# Patient Record
Sex: Male | Born: 1960 | Race: Black or African American | Hispanic: No | Marital: Single | Smoking: Never smoker
Health system: Southern US, Community
[De-identification: ages and names within clinical notes are randomized; demographics above are authoritative.]

## PROBLEM LIST (undated history)

## (undated) DIAGNOSIS — N289 Disorder of kidney and ureter, unspecified: Secondary | ICD-10-CM

## (undated) DIAGNOSIS — N200 Calculus of kidney: Secondary | ICD-10-CM

## (undated) HISTORY — PX: OTHER SURGICAL HISTORY: SHX169

## (undated) HISTORY — PX: SPLENECTOMY: SUR1306

## (undated) HISTORY — PX: BACK SURGERY: SHX140

---

## 2004-03-19 ENCOUNTER — Encounter: Admission: RE | Admit: 2004-03-19 | Discharge: 2004-03-19 | Payer: Self-pay | Admitting: Family Medicine

## 2004-03-21 ENCOUNTER — Encounter: Admission: RE | Admit: 2004-03-21 | Discharge: 2004-03-21 | Payer: Self-pay | Admitting: Family Medicine

## 2004-04-07 ENCOUNTER — Inpatient Hospital Stay (HOSPITAL_COMMUNITY): Admission: RE | Admit: 2004-04-07 | Discharge: 2004-04-09 | Payer: Self-pay | Admitting: Neurosurgery

## 2004-05-05 ENCOUNTER — Ambulatory Visit (HOSPITAL_COMMUNITY): Admission: RE | Admit: 2004-05-05 | Discharge: 2004-05-06 | Payer: Self-pay | Admitting: Neurosurgery

## 2004-06-03 ENCOUNTER — Encounter: Admission: RE | Admit: 2004-06-03 | Discharge: 2004-06-03 | Payer: Self-pay | Admitting: Neurosurgery

## 2004-08-03 ENCOUNTER — Encounter: Admission: RE | Admit: 2004-08-03 | Discharge: 2004-08-03 | Payer: Self-pay | Admitting: Neurosurgery

## 2004-09-30 ENCOUNTER — Encounter: Admission: RE | Admit: 2004-09-30 | Discharge: 2004-09-30 | Payer: Self-pay | Admitting: Neurosurgery

## 2005-04-06 ENCOUNTER — Encounter: Admission: RE | Admit: 2005-04-06 | Discharge: 2005-04-06 | Payer: Self-pay | Admitting: Neurosurgery

## 2007-09-30 ENCOUNTER — Emergency Department (HOSPITAL_COMMUNITY): Admission: EM | Admit: 2007-09-30 | Discharge: 2007-10-01 | Payer: Self-pay | Admitting: Emergency Medicine

## 2010-09-24 NOTE — Op Note (Signed)
NAME:  Perry Hart, Perry Hart NO.:  [id number]   MEDICAL RECORD NO.:  [id number]          PATIENT TYPE:  INP   LOCATION:  2899                         FACILITY:  [facility]   PHYSICIAN:  Donalee Citrin, M.D.        DATE OF BIRTH:  January 31, 1961   DATE OF PROCEDURE:  04/07/2004  DATE OF DISCHARGE:                                 OPERATIVE REPORT   PREOPERATIVE DIAGNOSIS:  Chiari 1 malformation with syringomyelia.   POSTOPERATIVE DIAGNOSIS:  Chiari 1 malformation with syringomyelia.   OPERATION PERFORMED:  Suboccipital craniectomy for decompression of Chiari 1  malformation and C1 laminectomy.   SURGEON:  Donalee Citrin, M.D.   ASSISTANT:  Tia Alert, MD   ANESTHESIA:  General endotracheal.   INDICATIONS FOR PROCEDURE:  The patient is a very pleasant 50 year old  gentleman who over the last several months has noticed progressive numbness  and weakness of his right arm and right leg and occasional numbness on the  right side of the face, no left-sided symptoms.  Physical exam revealed a  myelopathy and preoperative x-ray showed cervical spinal cord syrinx  extending from approximately C4-5 down to about T2.  This also showed a  Chiari 1 malformation with tonsil descension down to the superior aspect of  the C1 lamina as well as a C5-6 disk herniation.  It was felt that the  syringomyelia was predominantly due to Chiari malformation and the patient  was first recommended suboccipital craniectomy.  I extensively went over the  risks and benefits of surgery with her.  He understands and agrees to  proceed forward.   DESCRIPTION OF PROCEDURE:  The patient was brought to the operating room  where he was induced under general anesthesia.  Placed prone in pins, the  neck in slight flexion.  The back side of his head was shaved, prepped and  draped in the usual sterile fashion after infiltration of 10 mL of lidocaine  with epinephrine.  A midline incision was made just inferior to the  inion  down just inferior to the C2 spinous process. Bovie electrocautery was used  to take down subcutaneous tissue, taking care to stay in the midline.  Subperiosteal dissection was carried out around the lamina of C2, C1 and the  supocciput.  At the sub occiput has been exposed, a high speed drill was  used to drill craniectomies along each cerebellar hemisphere.  Then using a  3 mm Kerrison punch, the craniectomy was extended across the midline and  down, opening up the foramen magnum.  Then the C1 lamina was also removed  with a 3 mm Kerrison punch.  So when both cerebellar hemispheres, cervical  medullary junction and the dura underneath the C1 lamina was exposed, a  Nurolon was placed in the dura overlying the right cerebellar hemisphere and  this was used to tent the dura and incised with an 11 blade scalpel.  Then  using a hockey stick and an 11 blade scalpel, the dura was opened in a Y-  shaped fashion down along the cerebellar hemispheres meeting in the midline  extending down  above the cervical medullary junction and cervical spinal  cord behind the C1 lamina.  The tonsils were immediately visualized  extending down to the C1 lamina.  In addition there was noted to be dense  adhesions tethering the tonsils to the dorsal dura as well as to the spinal  cord.  In addition there was also dense adhesions tethering these tonsils to  each other.  Half of the dura had been tented back with Nurolon using  careful microdissection techniques with Roten dissectors.  The tonsils were  dissected off of the dorsal dura as well as the adhesions along the C1 and  distal C1 lamina were freed up intradurally.  The tonsils were freed up from  the cervical medullary junction and cervical spinal cord and the space  between the tonsils was opened up and the adhesions were lysed exposing the  obex of the fourth ventricle.  This was noted to be completely obstructing  spinal fluid flow until the obex  was opened up.  Cord was immediately  visualized and spinal fluid egress was achieved.  At the end of the lysis of  adhesions between the tonsils, the dorsal dura and the spinal cord, there  was noted to be free spinal fluid flow from the fourth ventricle between the  tonsils.  After this was achieved, a 6 x 8 mm Duragard patch was cut,  fashioned and sewn along the dural leaflets with a combination of running 4-  0 Nurolon and some interrupteds.  After the patch had been sewn together,  Valsalva was obtained.  There was no spinal fluid leak appreciated along the  suture line.  Tisseel was then applied along the suture line as well as  Duragen plus.  Then the muscle and fascia were reapproximated with 0  interrupted Vicryl and subcutaneous tissue was closed with 2-0 interrupted  Vicryl and the skin was closed with running locking nylon.  At the end of  the case all sponge, needle and instrument counts were correct. The patient  was taken out of pins, extubated and sent to recovery room in stable  condition.       GC/MEDQ  D:  04/07/2004  T:  04/07/2004  Job:  [id number]

## 2010-09-24 NOTE — Op Note (Signed)
NAME:  Perry Hart, Perry Hart NO.:  [id number]   MEDICAL RECORD NO.:  [id number]          PATIENT TYPE:  OIB   LOCATION:  2899                         FACILITY:  [facility]   PHYSICIAN:  Donalee Citrin, M.D.        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   PROCEDURE:  Anterior cervical discectomy and fusion at C5-C6 using a 7 mm  Lifenet wedge and 25 mm Atlantis plate with four 13 mm variable angle  screws.   SURGEON:  Donalee Citrin, M.D.   ASSISTANT:  Tia Alert, MD   ANESTHESIA:  General endotracheal anesthesia.   HISTORY OF PRESENT ILLNESS:  The patient is a very pleasant 50 year old  gentleman who had progressive worsening right hemiparesis with preop imaging  showing a very large cervical spinal cord syrinx, dissection of Chiari  malformation, as well as a very large central C5-C6 ruptured disc causing  spinal cord compression.  The patient, at that time, due to his spinal cord  compression both from the tonsillar compression of the cervical medullary  junction as well as the disc rupture, was recommended Chiari I decompression  with suboccipital decompression followed by an anterior cervical discectomy  and fusion.  The patient had undergone the Chiari decompression three weeks  prior and now presents for the anterior cervical.  The risks and benefits  were explained to the patient, he understood and agreed to proceed forth.   DESCRIPTION OF PROCEDURE:  The patient was brought to  the operating room,  given general anesthesia, positioned supine with the neck in slight  extension and 5 pounds of halter traction.  The right side of the neck was  prepped and draped in the usual sterile fashion.  The neck was palpated,  Chassaignac's tubercle was palpated.  A curvilinear incision was made just  off the midline, the platysma was dissected out and divided longitudinally.  The avascular plane between the sternocleidomastoid and strap muscles was  developed down to the prevertebral fascia.  The prevertebral fascia was  dissected with Kitners for mobilization of the C6-C7 disc space.  With a  needle in place, annulotomy was made at one disc space above that at C5-C6.  The disc space was adequately cleaned out with pituitary rongeurs, curets,  and a high speed drill.  Then, the operating microscope was draped and  brought onto the field.  Under microscopic illumination, a 1 mm Kerrison  punch was used to fully remote the posterior annulus.  The posterior  longitudinal ligament was then removed in a piecemeal fashion.  The thecal  sac was visualized and noted to be markedly displaced posteriorly from a  large  central and slightly leftward disc rupture displacing the thecal sac  and spinal cord.  This was teased away with a black nerve hook and removed  in piecemeal fashion.  There was still noted to be a large piece of disc  tethered to hypertrophied ligament underneath the C5 body, so an additional  amount of the 5 body had to be drilled down to get underneath the posterior  endplate and remove this section of disc herniation, chronic granulation  tissue, and hypertrophied ligament.  After all this was removed, the spinal  cord and thecal sac were markedly decompressed.  Both neural foramina were  explored, decompressed, and explored with an angled nerve hook and noted to  be widely patent.  Then, the wound was copiously irrigated, meticulous  hemostasis was maintained.  The  endplate was scraped and prepared to receive  a bone graft.  A 7 mm Lifenet wedge was inserted.  A 25 mm Atlantis plate  was and inserted.  Four 13 mm variable angle screws were drilled and placed.  All screws had excellent purchase.  Postop fluoroscopy confirmed good  position of the plate, screws, and bone graft.  The wound was copiously  irrigated and meticulous hemostasis was maintained.  The platysma was  reapproximated with 3-0 interrupted Vicryl and skin was closed with running  4-0 subcuticular.  Benzoin and Steri-Strips were applied.  The patient went  to the recovery room in stable condition.  At the end of the case, needle  count and sponge counts were correct.       GC/MEDQ  D:  05/05/2004  T:  05/05/2004  Job:  [id number]

## 2011-02-02 LAB — POCT I-STAT, CHEM 8
BUN: 20
Calcium, Ion: 1.23
Chloride: 106
Creatinine, Ser: 1.2
Glucose, Bld: 142 — ABNORMAL HIGH
HCT: 41
Hemoglobin: 13.9
Potassium: 3.7
Sodium: 141
TCO2: 26

## 2011-02-02 LAB — URINALYSIS, ROUTINE W REFLEX MICROSCOPIC
Glucose, UA: NEGATIVE
Hgb urine dipstick: NEGATIVE
Ketones, ur: 15 — AB
Nitrite: NEGATIVE
Protein, ur: NEGATIVE
Specific Gravity, Urine: 1.026
Urobilinogen, UA: 1
pH: 6

## 2011-02-02 LAB — DIFFERENTIAL
Basophils Absolute: 0
Basophils Relative: 0
Eosinophils Absolute: 0.3
Eosinophils Relative: 4
Lymphocytes Relative: 24
Lymphs Abs: 1.9
Monocytes Absolute: 0.4
Monocytes Relative: 6
Neutro Abs: 5
Neutrophils Relative %: 66

## 2011-02-02 LAB — POCT CARDIAC MARKERS
CKMB, poc: <1 — ABNORMAL LOW
Myoglobin, poc: 158
Operator id: 277751
Troponin i, poc: <0.05

## 2011-02-02 LAB — CBC
HCT: 38.8 — ABNORMAL LOW
Hemoglobin: 12.6 — ABNORMAL LOW
MCHC: 32.6
MCV: 90.3
Platelets: 267
RBC: 4.3
RDW: 13.6
WBC: 7.6

## 2018-10-31 ENCOUNTER — Other Ambulatory Visit: Payer: Self-pay | Admitting: *Deleted

## 2018-10-31 DIAGNOSIS — Z20822 Contact with and (suspected) exposure to covid-19: Secondary | ICD-10-CM

## 2018-10-31 NOTE — Progress Notes (Signed)
[id number] 

## 2018-11-03 LAB — NOVEL CORONAVIRUS, NAA: SARS-CoV-2, NAA: NOT DETECTED

## 2019-03-13 ENCOUNTER — Other Ambulatory Visit: Payer: Self-pay | Admitting: Cardiology

## 2019-03-13 DIAGNOSIS — Z20822 Contact with and (suspected) exposure to covid-19: Secondary | ICD-10-CM

## 2019-03-14 LAB — NOVEL CORONAVIRUS, NAA: SARS-CoV-2, NAA: NOT DETECTED

## 2019-07-15 ENCOUNTER — Other Ambulatory Visit: Payer: Self-pay | Admitting: Cardiology

## 2019-07-15 DIAGNOSIS — Z20822 Contact with and (suspected) exposure to covid-19: Secondary | ICD-10-CM

## 2019-07-15 NOTE — Progress Notes (Signed)
n

## 2019-07-16 LAB — NOVEL CORONAVIRUS, NAA: SARS-CoV-2, NAA: NOT DETECTED

## 2020-03-16 ENCOUNTER — Encounter (HOSPITAL_COMMUNITY): Payer: Self-pay

## 2020-03-16 ENCOUNTER — Other Ambulatory Visit: Payer: Self-pay

## 2020-03-16 ENCOUNTER — Ambulatory Visit (HOSPITAL_COMMUNITY)
Admission: EM | Admit: 2020-03-16 | Discharge: 2020-03-16 | Disposition: A | Discharge status: Home / Self Care | Payer: BC Managed Care – PPO | Attending: Family Medicine | Admitting: Family Medicine

## 2020-03-16 DIAGNOSIS — M5431 Sciatica, right side: Secondary | ICD-10-CM | POA: Diagnosis not present

## 2020-03-16 MED ORDER — TIZANIDINE HCL 4 MG PO TABS: 4.0000 mg | ORAL_TABLET | Freq: Four times a day (QID) | ORAL | 0 refills | Status: DC | PRN

## 2020-03-16 MED ORDER — PREDNISONE 10 MG (21) PO TBPK: ORAL_TABLET | ORAL | 0 refills | Status: DC

## 2020-03-16 NOTE — ED Triage Notes (Signed)
Pt is here with lower back/right leg pain that started 10 days ago, pt states he has a hx of back pain. Pt has taken Tylenol & Advil to relieve discomfort.

## 2020-03-16 NOTE — Discharge Instructions (Signed)
Treating you for sciatic nerve pain Take the medicines as prescribed Rest, stretching and alternating heat and ice.  Follow up as needed for continued or worsening symptoms

## 2020-03-16 NOTE — ED Provider Notes (Signed)
[facility]    CSN: [id number] Arrival date & time: 03/16/20  2423      History   Chief Complaint Chief Complaint  Patient presents with  . Back Pain  . Leg Pain    HPI Perry Hart is a 59 y.o. male.   Patient is a 59 year old male with no significant past medical history.  Presents today with lower back pain with radiation to right buttocks and right leg area.  There is some associated tingling and burning.  This has been present, waxing waning for the past 2 days.  Denies any specific falls or injuries to the back.  Taking Tylenol and Advil to relieve discomfort.  No fevers, urinary symptoms.  No extremity weakness, loss of bowel or bladder function     History reviewed. No pertinent past medical history.  There are no problems to display for this patient.   History reviewed. No pertinent surgical history.     Home Medications    Prior to Admission medications   Medication Sig Start Date End Date Taking? Authorizing Provider  predniSONE (STERAPRED UNI-PAK 21 TAB) 10 MG (21) TBPK tablet 6 tabs for 1 day, then 5 tabs for 1 das, then 4 tabs for 1 day, then 3 tabs for 1 day, 2 tabs for 1 day, then 1 tab for 1 day 03/16/20   Dahlia Byes A, NP  tiZANidine (ZANAFLEX) 4 MG tablet Take 1 tablet (4 mg total) by mouth every 6 (six) hours as needed for muscle spasms. 03/16/20   Janace Aris, NP    Family History Family History  Problem Relation Age of Onset  . Diabetes Mother   . Hypertension Father     Social History Social History   Tobacco Use  . Smoking status: Never Smoker  . Smokeless tobacco: Never Used  Substance Use Topics  . Alcohol use: Yes  . Drug use: Not on file     Allergies   Patient has no known allergies.   Review of Systems Review of Systems   Physical Exam Triage Vital Signs ED Triage Vitals  Enc Vitals Group     BP 03/16/20 1050 138/76     Pulse Rate 03/16/20 1050 90     Resp 03/16/20 1050 18     Temp 03/16/20  1050 97.8 F (36.6 C)     Temp Source 03/16/20 1050 Oral     SpO2 03/16/20 1050 100 %     Weight --      Height --      Head Circumference --      Peak Flow --      Pain Score 03/16/20 1047 7     Pain Loc --      Pain Edu? --      Excl. in GC? --    No data found.  Updated Vital Signs BP 138/76 (BP Location: Right Arm)   Pulse 90   Temp 97.8 F (36.6 C) (Oral)   Resp 18   SpO2 100%   Visual Acuity Right Eye Distance:   Left Eye Distance:   Bilateral Distance:    Right Eye Near:   Left Eye Near:    Bilateral Near:     Physical Exam Vitals and nursing note reviewed.  Constitutional:      Appearance: Normal appearance.  HENT:     Head: Normocephalic and atraumatic.  Eyes:     Conjunctiva/sclera: Conjunctivae normal.  Pulmonary:     Effort: Pulmonary effort is  normal.  Musculoskeletal:     Cervical back: Normal range of motion.     Lumbar back: Tenderness present. Positive right straight leg raise test.  Skin:    General: Skin is warm and dry.  Neurological:     Mental Status: He is alert.  Psychiatric:        Mood and Affect: Mood normal.      UC Treatments / Results  Labs (all labs ordered are listed, but only abnormal results are displayed) Labs Reviewed - No data to display  EKG   Radiology No results found.  Procedures Procedures (including critical care time)  Medications Ordered in UC Medications - No data to display  Initial Impression / Assessment and Plan / UC Course  I have reviewed the triage vital signs and the nursing notes.  Pertinent labs & imaging results that were available during my care of the patient were reviewed by me and considered in my medical decision making (see chart for details).     Right side sciatic nerve pain.  Pt has had this in the past  Will  try treatment with prednisone taper and Zanaflex as needed.  Continue stretching.  Alternate heat/ice.  For continued symptoms follow up for imaging.  Final  Clinical Impressions(s) / UC Diagnoses   Final diagnoses:  Sciatica of right side     Discharge Instructions     Treating you for sciatic nerve pain Take the medicines as prescribed Rest, stretching and alternating heat and ice.  Follow up as needed for continued or worsening symptoms     ED Prescriptions    Medication Sig Dispense Auth. Provider   predniSONE (STERAPRED UNI-PAK 21 TAB) 10 MG (21) TBPK tablet 6 tabs for 1 day, then 5 tabs for 1 das, then 4 tabs for 1 day, then 3 tabs for 1 day, 2 tabs for 1 day, then 1 tab for 1 day 21 tablet Esma Kilts A, NP   tiZANidine (ZANAFLEX) 4 MG tablet Take 1 tablet (4 mg total) by mouth every 6 (six) hours as needed for muscle spasms. 30 tablet Dahlia Byes A, NP     PDMP not reviewed this encounter.   Dahlia Byes A, NP 03/16/20 1400

## 2020-08-24 ENCOUNTER — Ambulatory Visit (INDEPENDENT_AMBULATORY_CARE_PROVIDER_SITE_OTHER): Payer: BC Managed Care – PPO

## 2020-08-24 ENCOUNTER — Ambulatory Visit (HOSPITAL_COMMUNITY)
Admission: EM | Admit: 2020-08-24 | Discharge: 2020-08-24 | Disposition: A | Discharge status: Home / Self Care | Payer: BC Managed Care – PPO | Attending: Internal Medicine | Admitting: Internal Medicine

## 2020-08-24 ENCOUNTER — Encounter (HOSPITAL_COMMUNITY): Payer: Self-pay | Admitting: Physician Assistant

## 2020-08-24 ENCOUNTER — Other Ambulatory Visit: Payer: Self-pay

## 2020-08-24 DIAGNOSIS — S63277A Dislocation of unspecified interphalangeal joint of left little finger, initial encounter: Secondary | ICD-10-CM

## 2020-08-24 DIAGNOSIS — S62647A Nondisplaced fracture of proximal phalanx of left little finger, initial encounter for closed fracture: Secondary | ICD-10-CM

## 2020-08-24 DIAGNOSIS — S6992XA Unspecified injury of left wrist, hand and finger(s), initial encounter: Secondary | ICD-10-CM

## 2020-08-24 DIAGNOSIS — X58XXXA Exposure to other specified factors, initial encounter: Secondary | ICD-10-CM | POA: Diagnosis not present

## 2020-08-24 MED ORDER — LIDOCAINE HCL 2 % IJ SOLN
INTRAMUSCULAR | Status: AC
  Filled 2020-08-24: qty 20

## 2020-08-24 NOTE — Discharge Instructions (Addendum)
Keep finger splint on until evaluated by orthopedics.  Please call [facility] and schedule appointment with hand surgeon for further evaluation and management.  You can use Tylenol ibuprofen for pain relief.  You can also use ice for swelling.  If you have any numbness, tingling, increased swelling, worsening pain need to be reevaluated.

## 2020-08-24 NOTE — ED Provider Notes (Signed)
[facility]    CSN: [id number] Arrival date & time: 08/24/20  1033      History   Chief Complaint Chief Complaint  Patient presents with  . Finger Injury    HPI Perry Hart is a 60 y.o. male.   Patient presents today with a 1 day history of left finger pain following injury.  Reports that he was playing volleyball with friends yesterday when the ball hit his left pinky finger and extended it laterally.  Since that time, he has had persistent pain and deformity.  He has tried Tylenol and taping finger together with improvement but not resolution of symptoms.  He reports that swelling and deformity have improved with this intervention but have not resolved.  He is right-handed.  He reports pain is rated 8 on a 0-10 pain scale, localized to left fifth MCP, described as aching periodic sharp pains, worse with certain movements, no alleviating factors identified.  He denies any numbness or paresthesias.  He denies previous injury or surgery on this hand.     History reviewed. No pertinent past medical history.  There are no problems to display for this patient.   History reviewed. No pertinent surgical history.     Home Medications    Prior to Admission medications   Medication Sig Start Date End Date Taking? Authorizing Provider  predniSONE (STERAPRED UNI-PAK 21 TAB) 10 MG (21) TBPK tablet 6 tabs for 1 day, then 5 tabs for 1 das, then 4 tabs for 1 day, then 3 tabs for 1 day, 2 tabs for 1 day, then 1 tab for 1 day 03/16/20   Dahlia Byes A, NP  tiZANidine (ZANAFLEX) 4 MG tablet Take 1 tablet (4 mg total) by mouth every 6 (six) hours as needed for muscle spasms. 03/16/20   Janace Aris, NP    Family History Family History  Problem Relation Age of Onset  . Diabetes Mother   . Hypertension Father     Social History Social History   Tobacco Use  . Smoking status: Never Smoker  . Smokeless tobacco: Never Used  Substance Use Topics  . Alcohol use: Yes      Allergies   Patient has no known allergies.   Review of Systems Review of Systems  Constitutional: Negative for activity change, appetite change, fatigue and fever.  Respiratory: Negative for cough and shortness of breath.   Cardiovascular: Negative for chest pain.  Gastrointestinal: Negative for abdominal pain, diarrhea, nausea and vomiting.  Musculoskeletal: Positive for arthralgias and joint swelling. Negative for myalgias.  Skin: Negative for color change and wound.  Neurological: Negative for dizziness, weakness, light-headedness and numbness.     Physical Exam Triage Vital Signs ED Triage Vitals  Enc Vitals Group     BP 08/24/20 1122 (!) 141/80     Pulse Rate 08/24/20 1122 71     Resp 08/24/20 1122 18     Temp 08/24/20 1122 97.8 F (36.6 C)     Temp Source 08/24/20 1122 Oral     SpO2 08/24/20 1122 98 %     Weight --      Height --      Head Circumference --      Peak Flow --      Pain Score 08/24/20 1120 8     Pain Loc --      Pain Edu? --      Excl. in GC? --    No data found.  Updated Vital Signs  BP (!) 141/80 (BP Location: Right Arm)   Pulse 71   Temp 97.8 F (36.6 C) (Oral)   Resp 18   SpO2 98%   Visual Acuity Right Eye Distance:   Left Eye Distance:   Bilateral Distance:    Right Eye Near:   Left Eye Near:    Bilateral Near:     Physical Exam Vitals reviewed.  Constitutional:      General: He is awake.     Appearance: Normal appearance. He is normal weight. He is not ill-appearing.     Comments: Very pleasant male appears stated age in no acute distress  HENT:     Head: Normocephalic and atraumatic.  Cardiovascular:     Rate and Rhythm: Normal rate and regular rhythm.     Pulses:          Radial pulses are 2+ on the right side and 2+ on the left side.     Heart sounds: No murmur heard.     Comments: Capillary refill within 2 seconds bilateral hands. Pulmonary:     Effort: Pulmonary effort is normal.     Breath sounds: Normal  breath sounds. No stridor. No wheezing, rhonchi or rales.  Musculoskeletal:     Left hand: Swelling, deformity and bony tenderness present. Decreased range of motion. There is no disruption of two-point discrimination. Normal capillary refill.     Comments: Left hand: Left fifth finger laterally displaced.  Swelling noted at MCP joints.  Decreased range of motion with flexion and extension.  Hand is neurovascularly intact.  Neurological:     Mental Status: He is alert.  Psychiatric:        Behavior: Behavior is cooperative.      UC Treatments / Results  Labs (all labs ordered are listed, but only abnormal results are displayed) Labs Reviewed - No data to display  EKG   Radiology DG Finger Little Left  Result Date: 08/24/2020 CLINICAL DATA:  Left fifth finger injury last night. EXAM: LEFT LITTLE FINGER 2+V COMPARISON:  None. FINDINGS: There appears to be posterior and medial dislocation of the fifth middle phalanx relative to the fifth proximal phalanx. Small crescent-shaped bone fragment is seen in the region of the fifth proximal interphalangeal joint consistent with small fracture. IMPRESSION: Dislocation of fifth middle phalanx relative to fifth proximal phalanx. Small fracture is noted. Electronically Signed   By: Lupita Raider M.D.   On: 08/24/2020 11:54    Procedures Procedures (including critical care time)  Medications Ordered in UC Medications - No data to display  Initial Impression / Assessment and Plan / UC Course  I have reviewed the triage vital signs and the nursing notes.  Pertinent labs & imaging results that were available during my care of the patient were reviewed by me and considered in my medical decision making (see chart for details).     X-ray shows dislocation of the fifth middle phalanx relative to fifth proximal phalanx and small fracture.  Finger was reduced in office by Linward Headland, PA-C.  Repeat x-ray obtained.  Patient splinted and instructed to  follow-up with hand surgeon.  See procedure note for additional information.  Final Clinical Impressions(s) / UC Diagnoses   Final diagnoses:  Closed dislocation of interphalangeal joint of left little finger  Nondisplaced fracture of proximal phalanx of left little finger, initial encounter for closed fracture  Finger injury, left, initial encounter   Discharge Instructions   None    ED Prescriptions  None     PDMP not reviewed this encounter.   Jeani Hawking, PA-C 08/24/20 1257

## 2020-08-24 NOTE — ED Provider Notes (Signed)
[facility]    CSN: [id number] Arrival date & time: 08/24/20  1033      History   Chief Complaint Chief Complaint  Patient presents with  . Finger Injury    HPI Perry Hart is a 60 y.o. male.   Patient seen by Dorann Ou PA-C, please see her note for full HPI.  In short, patient with left 5th finger injury last night while playing volleyball. Some numbness/tingling to the tip of finger. Otherwise deformity and swelling to PIP joint and having trouble moving finger.      History reviewed. No pertinent past medical history.  There are no problems to display for this patient.   History reviewed. No pertinent surgical history.     Home Medications    Prior to Admission medications   Medication Sig Start Date End Date Taking? Authorizing Provider  predniSONE (STERAPRED UNI-PAK 21 TAB) 10 MG (21) TBPK tablet 6 tabs for 1 day, then 5 tabs for 1 das, then 4 tabs for 1 day, then 3 tabs for 1 day, 2 tabs for 1 day, then 1 tab for 1 day 03/16/20   Dahlia Byes A, NP  tiZANidine (ZANAFLEX) 4 MG tablet Take 1 tablet (4 mg total) by mouth every 6 (six) hours as needed for muscle spasms. 03/16/20   Janace Aris, NP    Family History Family History  Problem Relation Age of Onset  . Diabetes Mother   . Hypertension Father     Social History Social History   Tobacco Use  . Smoking status: Never Smoker  . Smokeless tobacco: Never Used  Substance Use Topics  . Alcohol use: Yes     Allergies   Patient has no known allergies.   Review of Systems Review of Systems   Physical Exam Triage Vital Signs ED Triage Vitals  Enc Vitals Group     BP 08/24/20 1122 (!) 141/80     Pulse Rate 08/24/20 1122 71     Resp 08/24/20 1122 18     Temp 08/24/20 1122 97.8 F (36.6 C)     Temp Source 08/24/20 1122 Oral     SpO2 08/24/20 1122 98 %     Weight --      Height --      Head Circumference --      Peak Flow --      Pain Score 08/24/20 1120 8     Pain Loc  --      Pain Edu? --      Excl. in GC? --    No data found.  Updated Vital Signs BP (!) 141/80 (BP Location: Right Arm)   Pulse 71   Temp 97.8 F (36.6 C) (Oral)   Resp 18   SpO2 98%   Physical Exam Constitutional:      General: He is not in acute distress.    Appearance: Normal appearance. He is well-developed. He is not toxic-appearing or diaphoretic.  HENT:     Head: Normocephalic and atraumatic.  Eyes:     Conjunctiva/sclera: Conjunctivae normal.     Pupils: Pupils are equal, round, and reactive to light.  Pulmonary:     Effort: Pulmonary effort is normal. No respiratory distress.  Musculoskeletal:     Cervical back: Normal range of motion and neck supple.     Comments: Swelling with dislocation to the PIP joint of left 5th finger. Tenderness to PIP. NVI  Skin:    General: Skin is warm and dry.  Neurological:     Mental Status: He is alert and oriented to person, place, and time.      UC Treatments / Results  Labs (all labs ordered are listed, but only abnormal results are displayed) Labs Reviewed - No data to display  EKG   Radiology DG Finger Little Left  Result Date: 08/24/2020 CLINICAL DATA:  Reduction of fifth finger dislocation. EXAM: LEFT LITTLE FINGER 2+V COMPARISON:  Earlier films, same date. FINDINGS: Interval reduction of the PIP joint dislocation. No fracture is identified. IMPRESSION: Interval reduction of PIP joint dislocation. No fracture. Electronically Signed   By: Rudie Meyer M.D.   On: 08/24/2020 13:17   DG Finger Little Left  Result Date: 08/24/2020 CLINICAL DATA:  Left fifth finger injury last night. EXAM: LEFT LITTLE FINGER 2+V COMPARISON:  None. FINDINGS: There appears to be posterior and medial dislocation of the fifth middle phalanx relative to the fifth proximal phalanx. Small crescent-shaped bone fragment is seen in the region of the fifth proximal interphalangeal joint consistent with small fracture. IMPRESSION: Dislocation of fifth  middle phalanx relative to fifth proximal phalanx. Small fracture is noted. Electronically Signed   By: Lupita Raider M.D.   On: 08/24/2020 11:54    Procedures Orthopedic Injury Treatment  Date/Time: 08/24/2020 12:30 PM Performed by: Belinda Fisher, PA-C Authorized by: Belinda Fisher, PA-C   Consent:    Consent obtained:  Verbal   Consent given by:  Patient   Risks discussed:  Fracture, restricted joint movement, nerve damage, recurrent dislocation, stiffness and irreducible dislocation   Alternatives discussed:  ReferralInjury location: finger Location details: left little finger Injury type: fracture-dislocation Fracture type: proximal phalanx MCP joint involved: no Any IP joint involved: yes Pre-procedure distal perfusion: normal Pre-procedure neurological function: normal Pre-procedure range of motion: reduced  Anesthesia: Local anesthesia used: digital block. Manipulation performed: yes Skin traction used: no Skeletal traction used: no Reduction successful: yes X-ray confirmed reduction: yes Immobilization: splint Splint type: static finger Splint Applied by: ED Nurse Post-procedure distal perfusion: normal Post-procedure neurological function comment: unable to perform due to digital block Post-procedure range of motion: normal    (including critical care time)  Medications Ordered in UC Medications - No data to display  Initial Impression / Assessment and Plan / UC Course  I have reviewed the triage vital signs and the nursing notes.  Pertinent labs & imaging results that were available during my care of the patient were reviewed by me and considered in my medical decision making (see chart for details).    Patient tolerated procedure well. Given digital block, was unable to access neurological function. However, full ROM of finger, cap refill <2s. Patient will monitor for numbness/tingling after procedure. Repeat xray shows reduced PIP joint. Finger splint  applied.  Please see plan as per Erin Raspet PA-C.  Final Clinical Impressions(s) / UC Diagnoses   Final diagnoses:  Closed dislocation of interphalangeal joint of left little finger  Nondisplaced fracture of proximal phalanx of left little finger, initial encounter for closed fracture  Finger injury, left, initial encounter    ED Prescriptions    None     PDMP not reviewed this encounter.   Belinda Fisher, PA-C 08/24/20 1324

## 2020-08-24 NOTE — ED Triage Notes (Signed)
Pt reports hitting the ball on the tip of small finger last night. Pt reports small finger staed to swell and was bed at an odd angle.

## 2021-06-21 IMAGING — DX DG FINGER LITTLE 2+V*L*
3 series · 3 of 3 positions shown · non-contrast
Comparison: Earlier films, same date.

CLINICAL DATA: Reduction of fifth finger dislocation.

EXAM:
LEFT LITTLE FINGER 2+V

[finger ap]
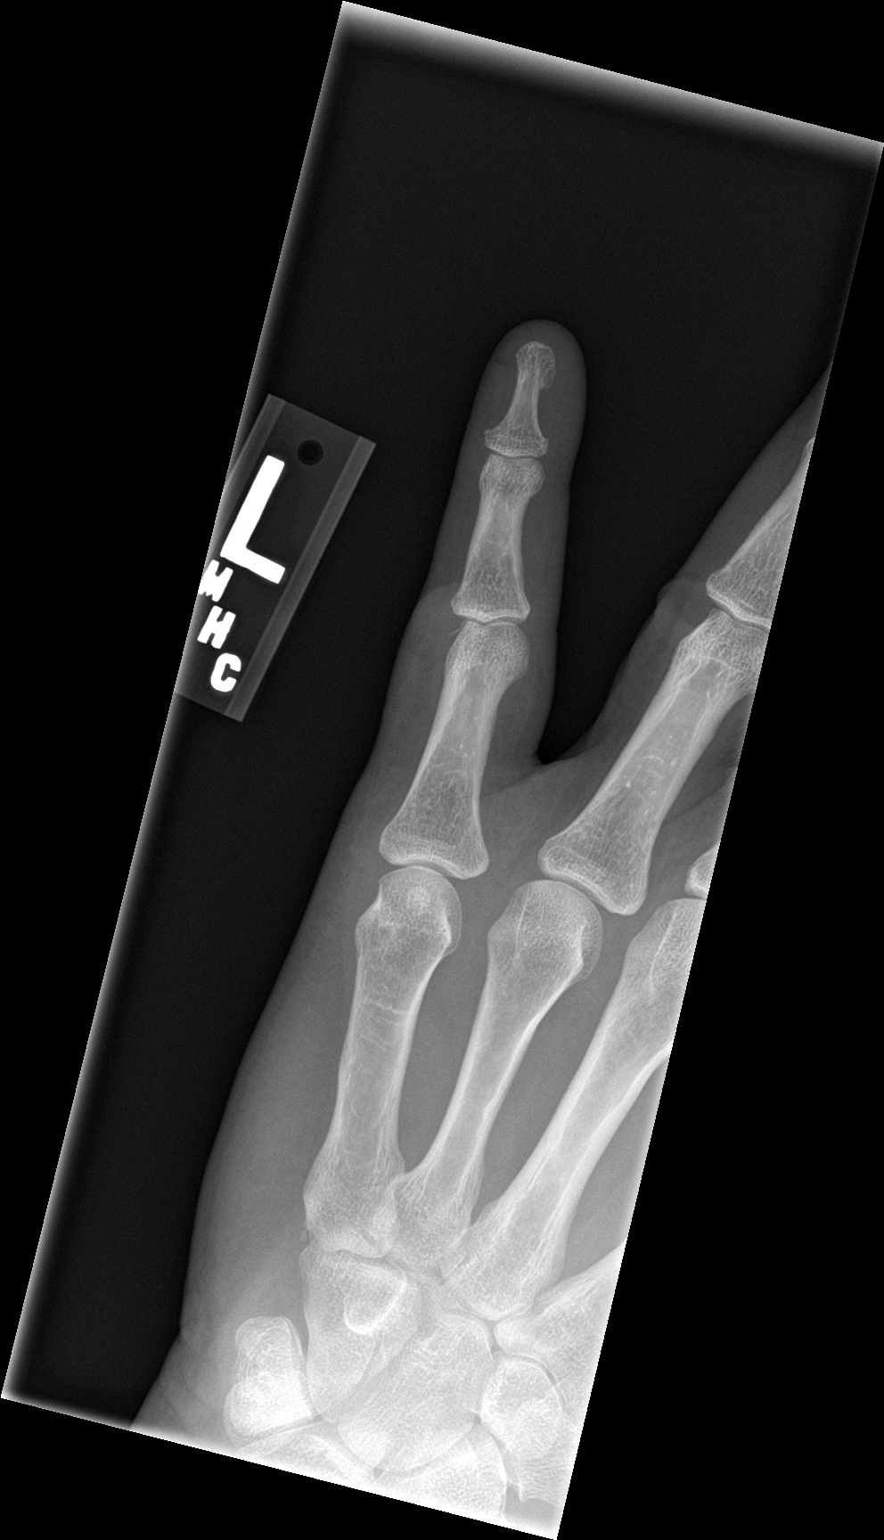

[finger obl]
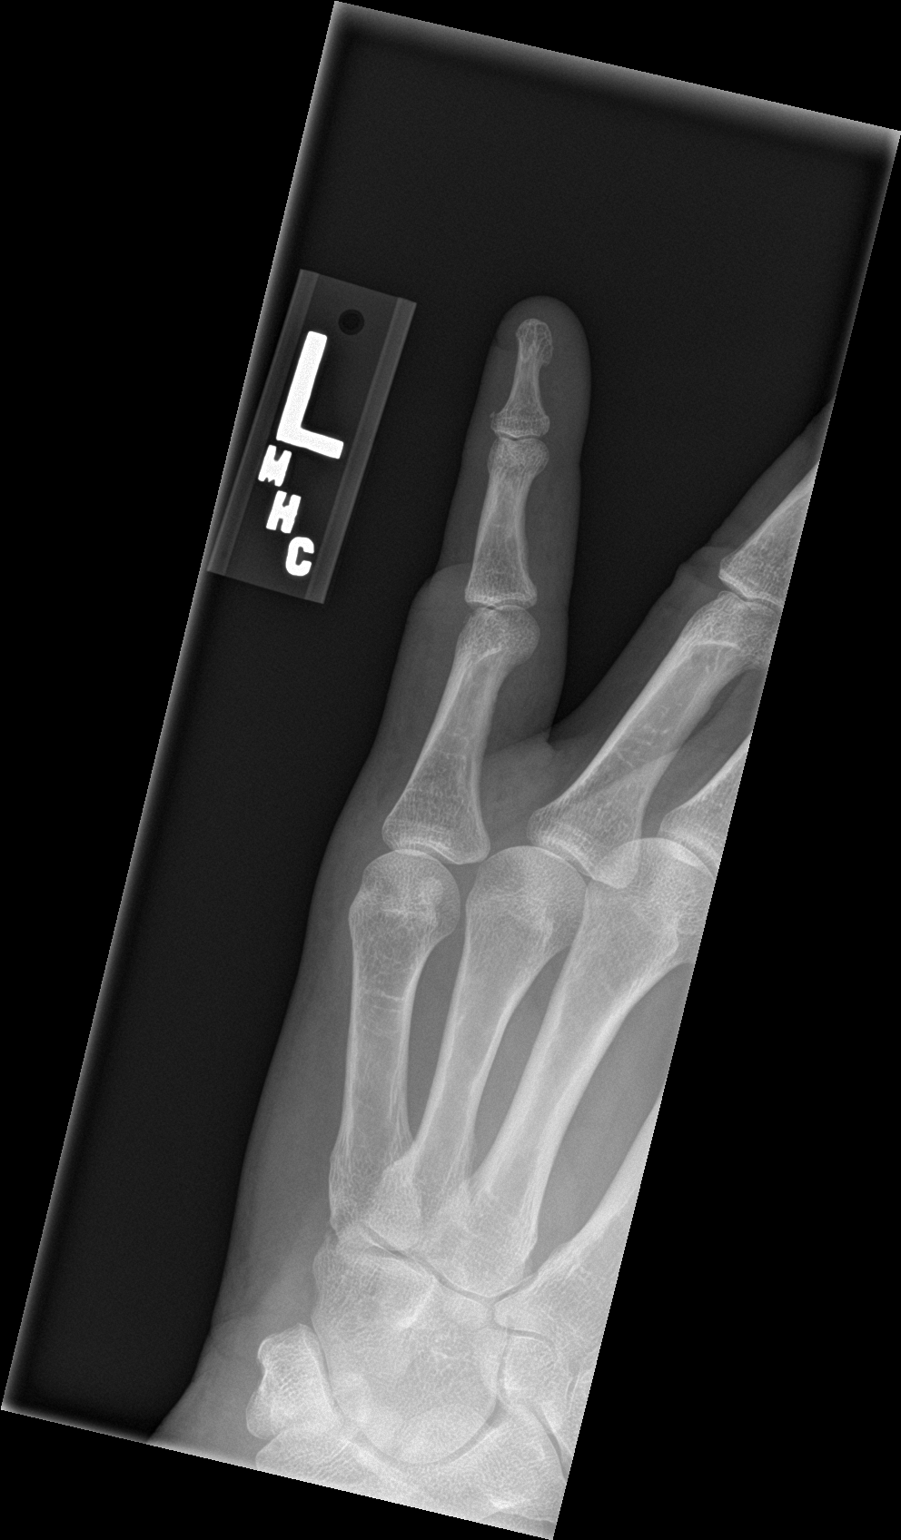

[finger lat]
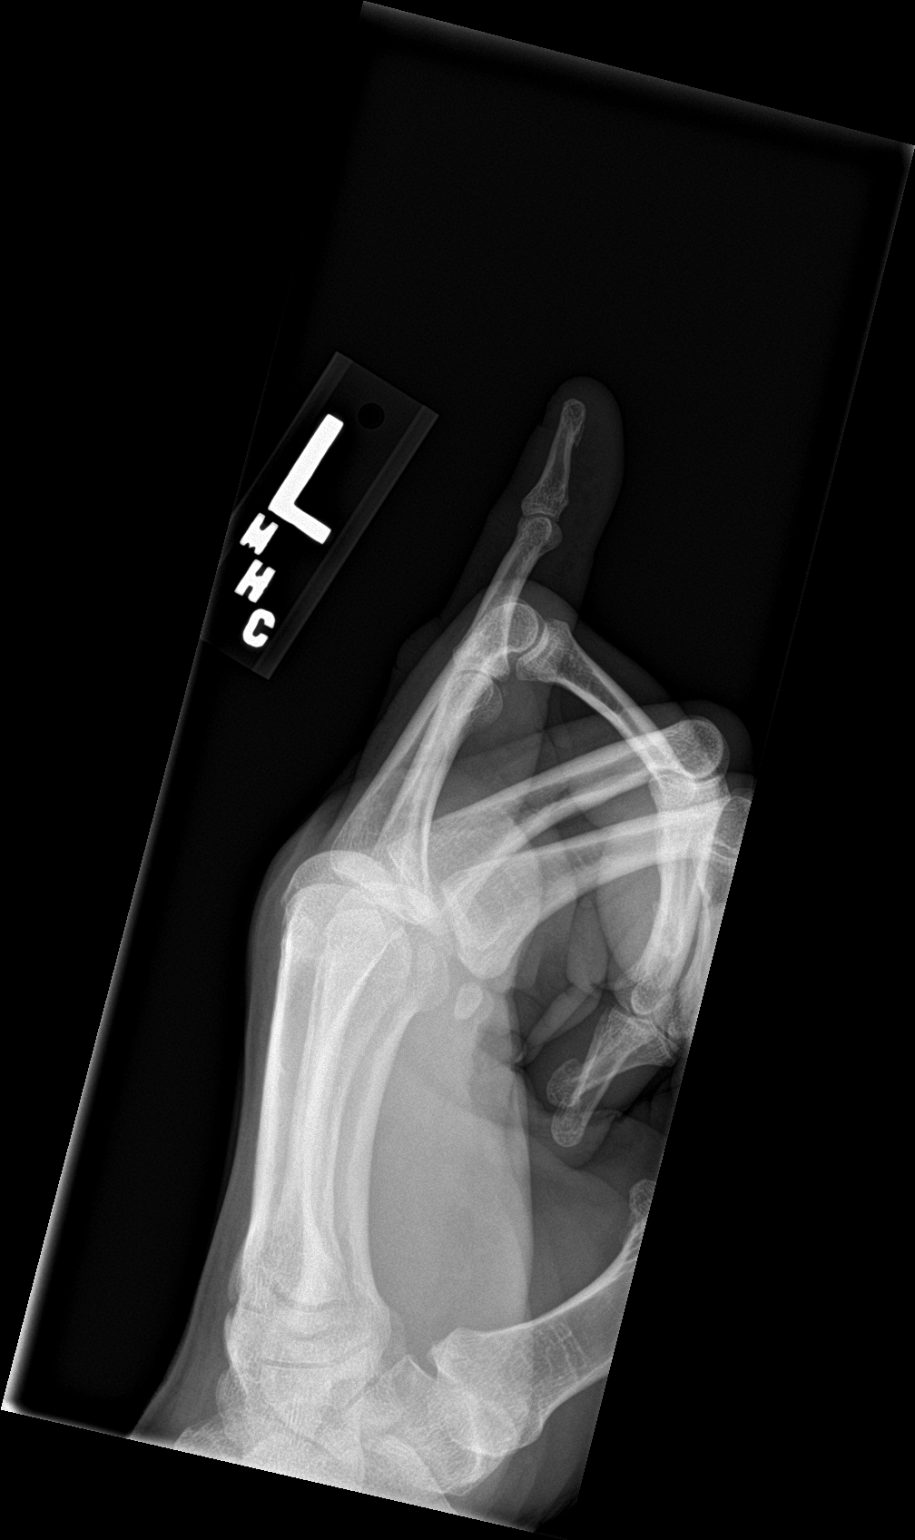

[3 of 3 positions shown; findings below may reference images not displayed]

FINDINGS: Interval reduction of the PIP joint dislocation. No fracture is
identified.
IMPRESSION: Interval reduction of PIP joint dislocation. No fracture.

## 2021-06-21 IMAGING — DX DG FINGER LITTLE 2+V*L*
3 series · 4 of 4 positions shown · non-contrast
Comparison: None.

CLINICAL DATA: Left fifth finger injury last night.

EXAM:
LEFT LITTLE FINGER 2+V

[finger ap]
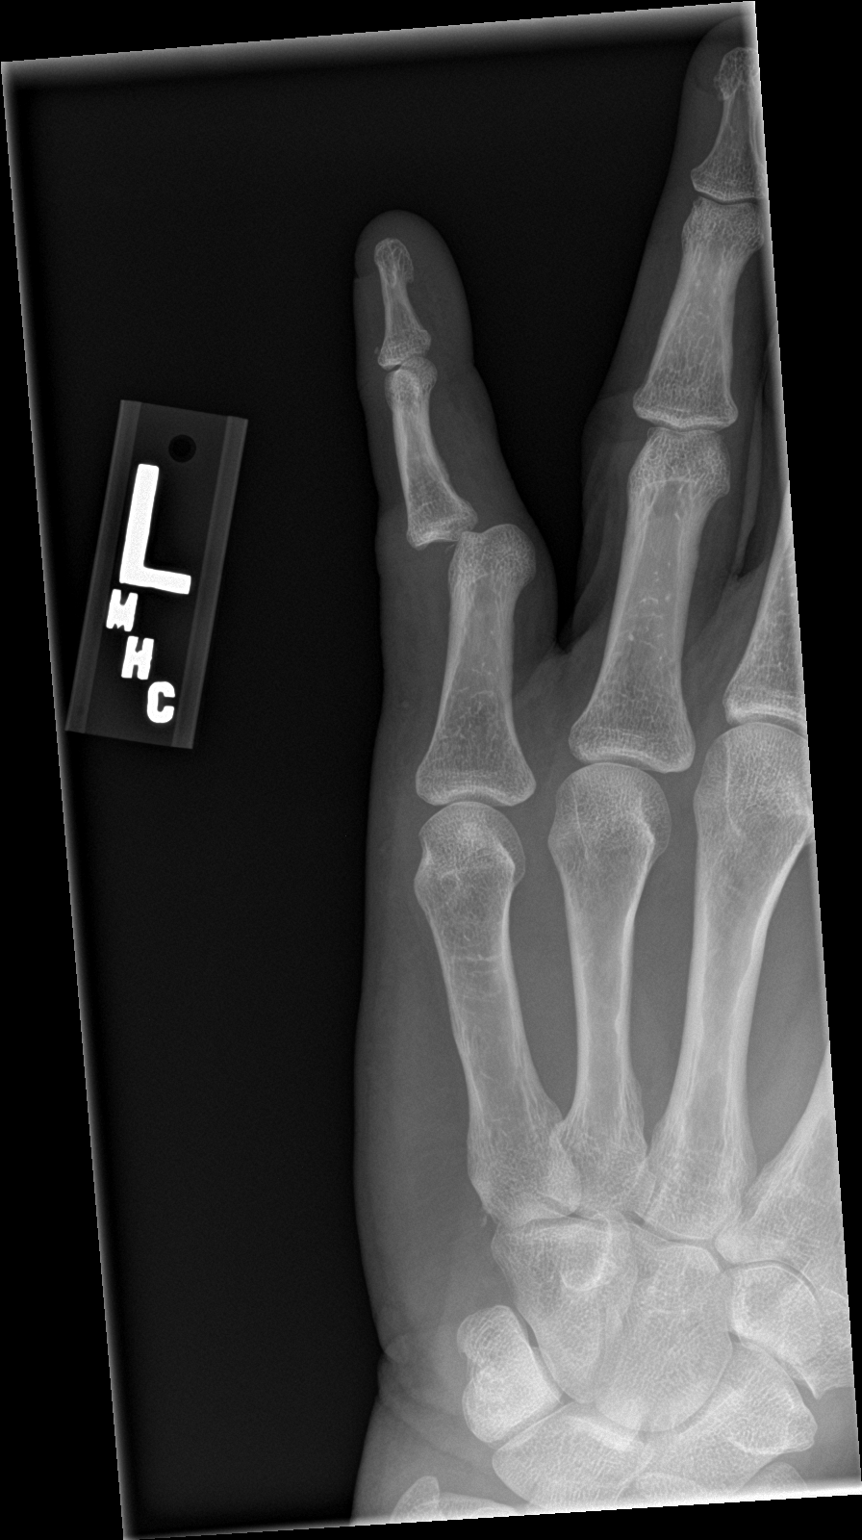

[finger obl]
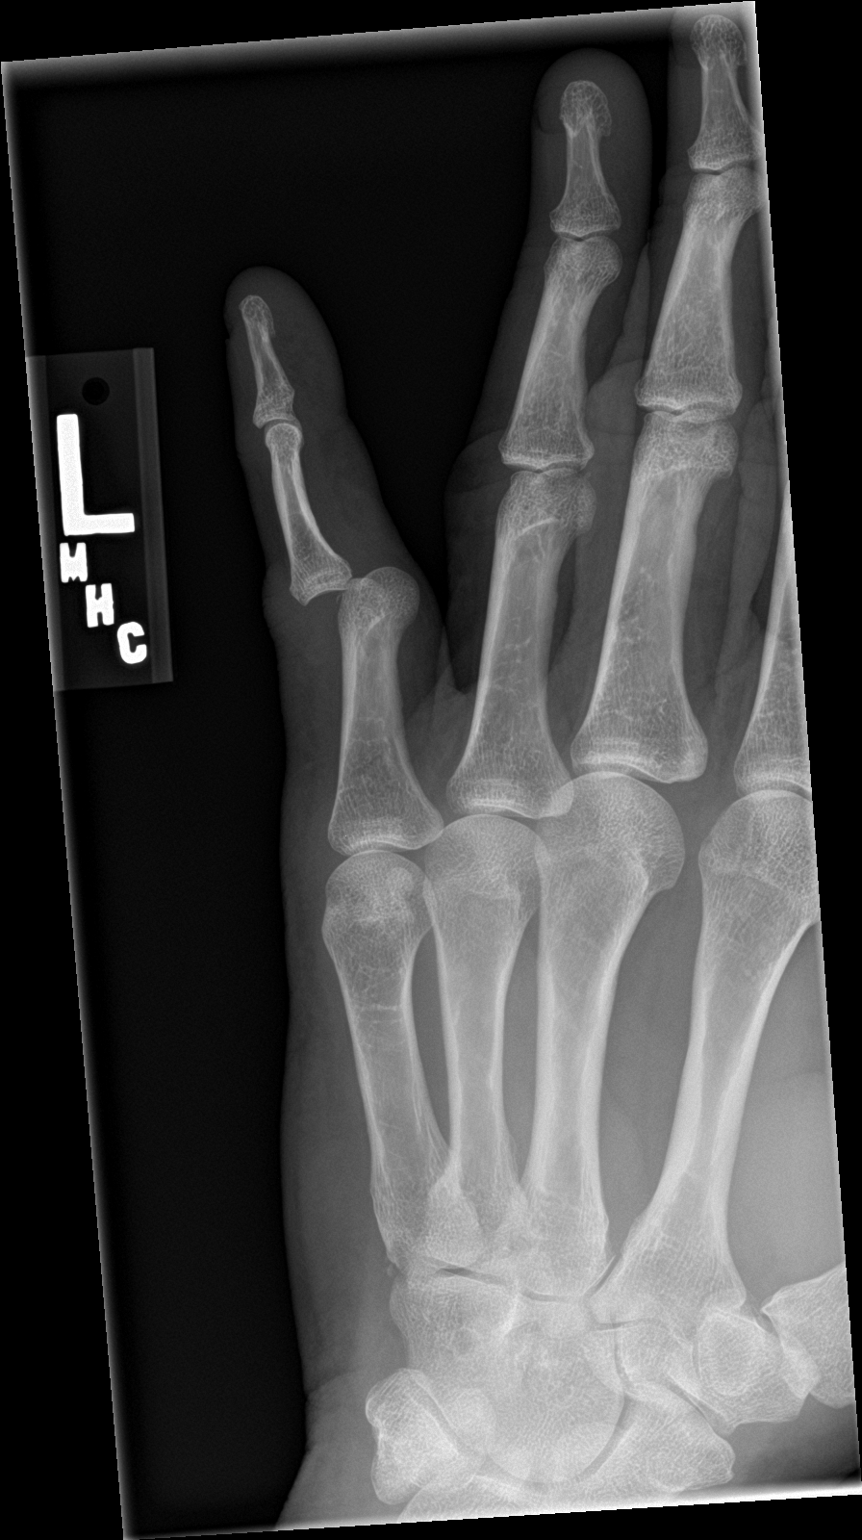

[Series 3: finger lat · 0.14mm/px · 2 of 2 slices shown]
[im 1/2]
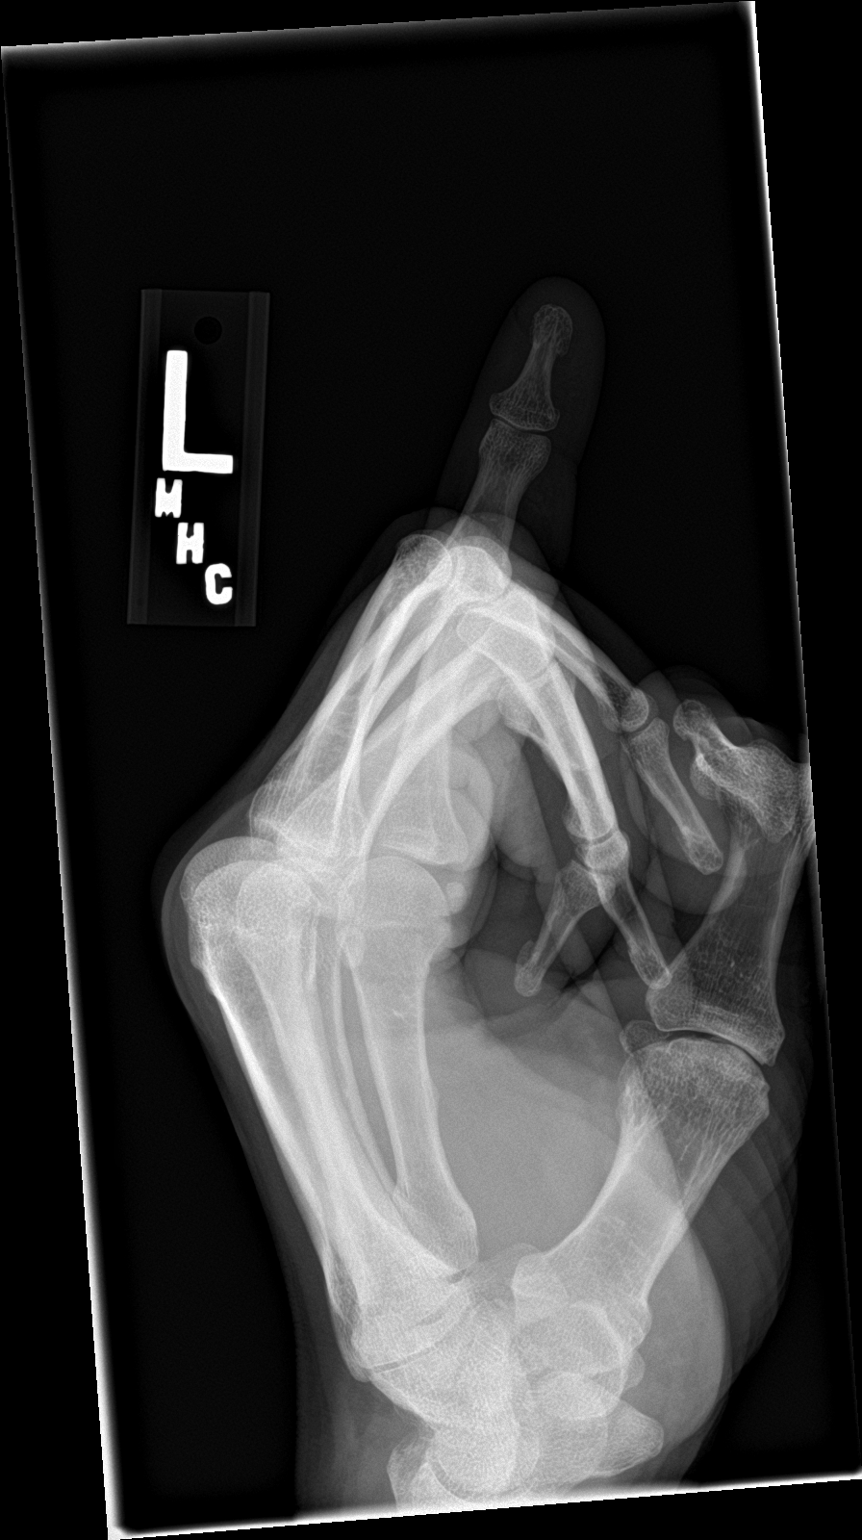
[im 2/2]
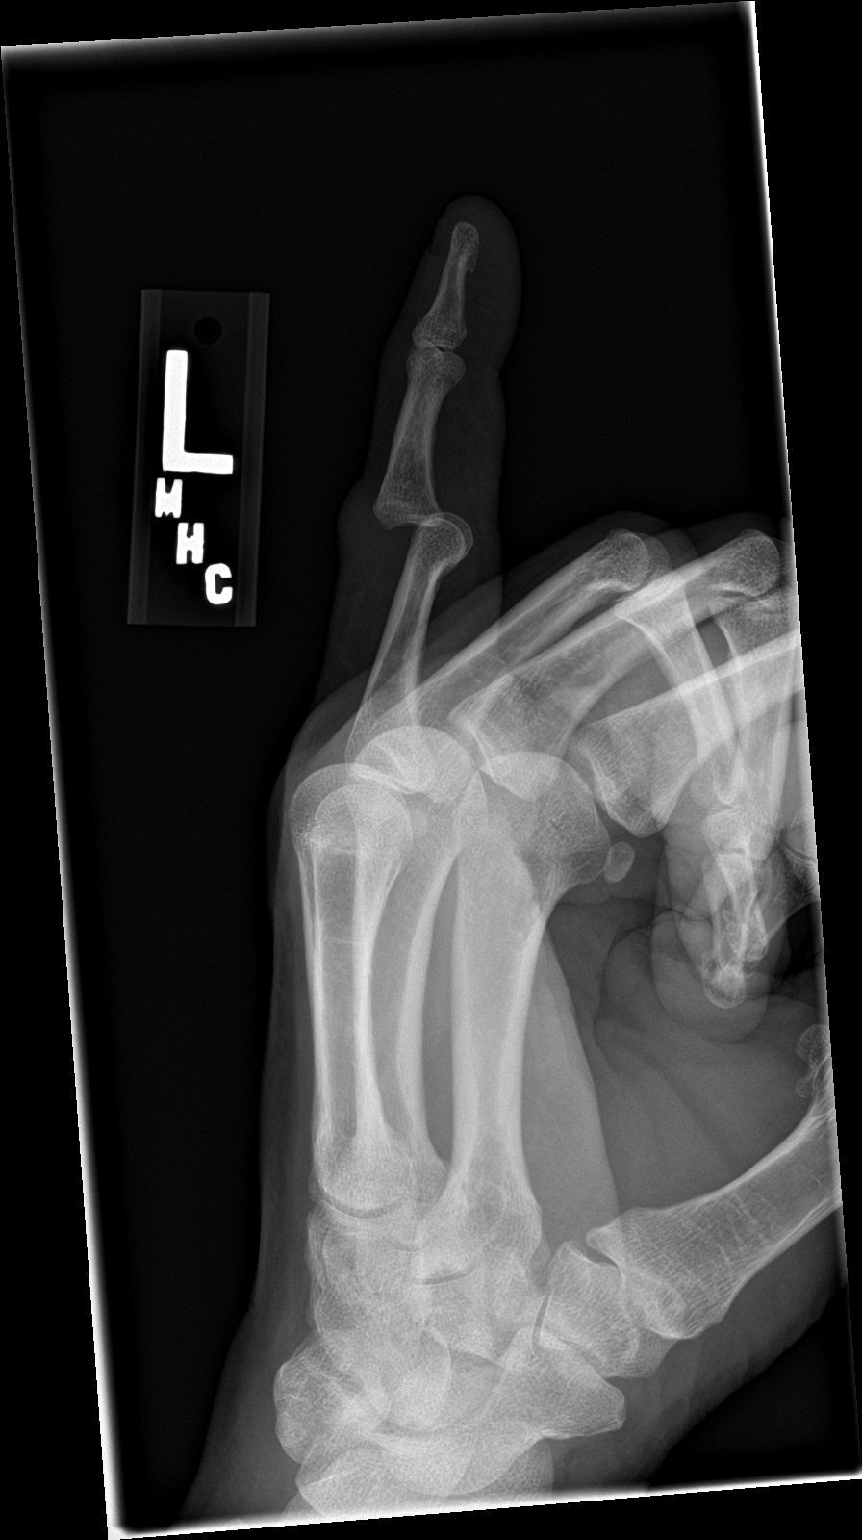

[4 of 4 positions shown; findings below may reference images not displayed]

FINDINGS: There appears to be posterior and medial dislocation of the fifth
middle phalanx relative to the fifth proximal phalanx. Small
crescent-shaped bone fragment is seen in the region of the fifth
proximal interphalangeal joint consistent with small fracture.
IMPRESSION: Dislocation of fifth middle phalanx relative to fifth proximal
phalanx. Small fracture is noted.

## 2022-11-08 ENCOUNTER — Other Ambulatory Visit: Payer: Self-pay

## 2022-11-08 ENCOUNTER — Encounter (HOSPITAL_COMMUNITY): Payer: Self-pay | Admitting: Emergency Medicine

## 2022-11-08 ENCOUNTER — Ambulatory Visit (HOSPITAL_COMMUNITY)
Admission: EM | Admit: 2022-11-08 | Discharge: 2022-11-08 | Disposition: A | Discharge status: Home / Self Care | Payer: BC Managed Care – PPO | Attending: Emergency Medicine | Admitting: Emergency Medicine

## 2022-11-08 DIAGNOSIS — S161XXA Strain of muscle, fascia and tendon at neck level, initial encounter: Secondary | ICD-10-CM | POA: Diagnosis not present

## 2022-11-08 MED ORDER — KETOROLAC TROMETHAMINE 30 MG/ML IJ SOLN
INTRAMUSCULAR | Status: AC
  Filled 2022-11-08: qty 1

## 2022-11-08 MED ORDER — PREDNISONE 10 MG (21) PO TBPK: ORAL_TABLET | ORAL | 0 refills | Status: DC

## 2022-11-08 MED ORDER — TIZANIDINE HCL 4 MG PO TABS: 4.0000 mg | ORAL_TABLET | Freq: Four times a day (QID) | ORAL | 0 refills | Status: DC | PRN

## 2022-11-08 MED ORDER — KETOROLAC TROMETHAMINE 30 MG/ML IJ SOLN
30.0000 mg | Freq: Once | INTRAMUSCULAR | Status: AC
  Administered 2022-11-08: 30 mg via INTRAMUSCULAR

## 2022-11-08 NOTE — Discharge Instructions (Signed)
Your pain is most likely caused by irritation to the muscles.  You have been given an injection of Toradol which helps to reduce inflammation in turn helps with pain, daily you will start to see some relief in about 30 minutes to an hour  Starting tomorrow take prednisone as directed to continue the above process, may take Tylenol in addition to this medicine  You may use muscle relaxant every 6 hours as needed for additional comfort.  Be mindful of this may make you feel drowsy  You may use heating pad in 15 minute intervals as needed for additional comfort, you may find comfort in using ice in 10-15 minutes over affected area  Begin stretching affected area daily for 10 minutes as tolerated to further loosen muscles   When sitting and lying down place pillow behind neck   Can try sleeping without pillow on firm mattress   Practice good posture: head back, shoulders back, chest forward, pelvis back and weight distributed evenly on both legs  If pain persist after recommended treatment or reoccurs if may be beneficial to follow up with orthopedic specialist for evaluation, this doctor specializes in the bones and can manage your symptoms long-term with options such as but not limited to imaging, medications or physical therapy

## 2022-11-08 NOTE — ED Triage Notes (Signed)
Pt c/o right side neck pain for the past 2 weeks getting worse today, denies any fall or injury.

## 2022-11-08 NOTE — ED Provider Notes (Signed)
[facility]    CSN: [id number] Arrival date & time: 11/08/22  1343      History   Chief Complaint Chief Complaint  Patient presents with   Neck Pain    Pt c/o right side neck pain for the past 2 weeks getting worse today, denies any fall or injury.    HPI Perry Hart is a 62 y.o. male.    Patient presents for evaluation of right-sided posterior neck pain present for 2 weeks.  Pain has been constant fluctuating intensity.  Exacerbated with turning the head to the right side which causes pain intermittently radiating to the shoulder.  Denies injury or trauma or precipitating event prior to symptoms.  Has attempted use of ibuprofen and Tylenol which has been minimally effective.  Denies numbness or tingling.  History of a cervical spine surgery, unable to recall exact details.       History reviewed. No pertinent past medical history.  There are no problems to display for this patient.   History reviewed. No pertinent surgical history.     Home Medications    Prior to Admission medications   Medication Sig Start Date End Date Taking? Authorizing Provider  predniSONE (STERAPRED UNI-PAK 21 TAB) 10 MG (21) TBPK tablet 6 tabs for 1 day, then 5 tabs for 1 das, then 4 tabs for 1 day, then 3 tabs for 1 day, 2 tabs for 1 day, then 1 tab for 1 day 03/16/20   Dahlia Byes A, NP  tiZANidine (ZANAFLEX) 4 MG tablet Take 1 tablet (4 mg total) by mouth every 6 (six) hours as needed for muscle spasms. 03/16/20   Janace Aris, NP    Family History Family History  Problem Relation Age of Onset   Diabetes Mother    Hypertension Father     Social History Social History   Tobacco Use   Smoking status: Never   Smokeless tobacco: Never  Substance Use Topics   Alcohol use: Yes     Allergies   Patient has no known allergies.   Review of Systems Review of Systems  Musculoskeletal:  Positive for neck pain.     Physical Exam Triage Vital Signs ED Triage  Vitals [11/08/22 1355]  Enc Vitals Group     BP 126/70     Pulse Rate 100     Resp 18     Temp 98.2 F (36.8 C)     Temp Source Oral     SpO2 98 %     Weight 155 lb (70.3 kg)     Height 6' (1.829 m)     Head Circumference      Peak Flow      Pain Score 6     Pain Loc      Pain Edu?      Excl. in GC?    No data found.  Updated Vital Signs BP 126/70 (BP Location: Right Arm)   Pulse 100   Temp 98.2 F (36.8 C) (Oral)   Resp 18   Ht 6' (1.829 m)   Wt 155 lb (70.3 kg)   SpO2 98%   BMI 21.02 kg/m   Visual Acuity Right Eye Distance:   Left Eye Distance:   Bilateral Distance:    Right Eye Near:   Left Eye Near:    Bilateral Near:     Physical Exam Constitutional:      Appearance: Normal appearance.  Eyes:     Extraocular Movements: Extraocular movements intact.  Neck:     Comments: Unable to reproduce tenderness, no ecchymosis swelling deformity crepitus or rigidity, able to complete range of motion, pain is elicited with lateral turns, 2+ carotid pulses, strength is a 5 out of 5 Pulmonary:     Effort: Pulmonary effort is normal.  Neurological:     Mental Status: He is alert.      UC Treatments / Results  Labs (all labs ordered are listed initial encounter  But only abnormal results are displayed) Labs Reviewed - No data to display  EKG   Radiology No results found.  Procedures Procedures (including critical care time)  Medications Ordered in UC Medications - No data to display  Initial Impression / Assessment and Plan / UC Course  I have reviewed the triage vital signs and the nursing notes.  Pertinent labs & imaging results that were available during my care of the patient were reviewed by me and considered in my medical decision making (see chart for details).  Strain of the neck muscle, initial encounter  Etiology is most likely muscular, low suspicion for spinal involvement, discussed this with patient Zanaflex for outpatient use,  recommended RICE heat massage stretching and activity as tolerated, given walker referral to orthopedics if symptoms persist or worsen Final Clinical Impressions(s) / UC Diagnoses   Final diagnoses:  None   Discharge Instructions   None    ED Prescriptions   None    PDMP not reviewed this encounter.   Valinda Hoar, NP 11/08/22 1427

## 2022-12-09 ENCOUNTER — Encounter (HOSPITAL_COMMUNITY): Payer: Self-pay

## 2022-12-09 ENCOUNTER — Ambulatory Visit (HOSPITAL_COMMUNITY)
Admission: EM | Admit: 2022-12-09 | Discharge: 2022-12-09 | Disposition: A | Discharge status: Home / Self Care | Payer: BC Managed Care – PPO

## 2022-12-09 DIAGNOSIS — N5089 Other specified disorders of the male genital organs: Secondary | ICD-10-CM

## 2022-12-09 HISTORY — DX: Calculus of kidney: N20.0

## 2022-12-09 HISTORY — DX: Disorder of kidney and ureter, unspecified: N28.9

## 2022-12-09 NOTE — ED Provider Notes (Addendum)
[facility]    CSN: [id number] Arrival date & time: 12/09/22  1319      History   Chief Complaint Chief Complaint  Patient presents with   groin issue    HPI Perry Hart is a 62 y.o. male. He reports a mass in his L scrotum for more than 6 months. It moves around, up and down, within his scrotum at times. He feels it is getting bigger. Not painful but sometimes bothersome. No problems on the R  HPI  Past Medical History:  Diagnosis Date   Kidney stones    Renal disorder     There are no problems to display for this patient.   Past Surgical History:  Procedure Laterality Date   BACK SURGERY     cervical dissectomy     SPLENECTOMY         Home Medications    Prior to Admission medications   Medication Sig Start Date End Date Taking? Authorizing Provider  predniSONE (STERAPRED UNI-PAK 21 TAB) 10 MG (21) TBPK tablet 6 tabs for 1 day, then 5 tabs for 1 das, then 4 tabs for 1 day, then 3 tabs for 1 day, 2 tabs for 1 day, then 1 tab for 1 day 11/08/22   Salli Quarry R, NP  tiZANidine (ZANAFLEX) 4 MG tablet Take 1 tablet (4 mg total) by mouth every 6 (six) hours as needed for muscle spasms. 11/08/22   Valinda Hoar, NP    Family History Family History  Problem Relation Age of Onset   Diabetes Mother    Hypertension Father     Social History Social History   Tobacco Use   Smoking status: Never   Smokeless tobacco: Never  Vaping Use   Vaping status: Never Used  Substance Use Topics   Alcohol use: Yes   Drug use: Never     Allergies   Patient has no known allergies.   Review of Systems Review of Systems   Physical Exam Triage Vital Signs ED Triage Vitals  Encounter Vitals Group     BP 12/09/22 1413 127/75     Systolic BP Percentile --      Diastolic BP Percentile --      Pulse Rate 12/09/22 1413 84     Resp 12/09/22 1413 14     Temp 12/09/22 1413 (!) 97.4 F (36.3 C)     Temp Source 12/09/22 1413 Oral     SpO2 12/09/22  1413 97 %     Weight --      Height --      Head Circumference --      Peak Flow --      Pain Score 12/09/22 1415 3     Pain Loc --      Pain Education --      Exclude from Growth Chart --    No data found.  Updated Vital Signs BP 127/75 (BP Location: Right Arm)   Pulse 84   Temp (!) 97.4 F (36.3 C) (Oral)   Resp 14   SpO2 97%   Visual Acuity Right Eye Distance:   Left Eye Distance:   Bilateral Distance:    Right Eye Near:   Left Eye Near:    Bilateral Near:     Physical Exam Constitutional:      Appearance: Normal appearance.  Pulmonary:     Effort: Pulmonary effort is normal.  Abdominal:     Hernia: There is no hernia in the left  inguinal area or right inguinal area.  Genitourinary:    Testes: Normal.     Comments: 2cm circumscribed, smooth mass in L scrotum above testicle, not associated with testicle. Not painful to palpation.  Lymphadenopathy:     Lower Body: No right inguinal adenopathy. No left inguinal adenopathy.  Neurological:     Mental Status: He is alert.      UC Treatments / Results  Labs (all labs ordered are listed, but only abnormal results are displayed) Labs Reviewed - No data to display  EKG   Radiology No results found.  Procedures Procedures (including critical care time)  Medications Ordered in UC Medications - No data to display  Initial Impression / Assessment and Plan / UC Course  I have reviewed the triage vital signs and the nursing notes.  Pertinent labs & imaging results that were available during my care of the patient were reviewed by me and considered in my medical decision making (see chart for details).    I suspect some kind of cystic process in scrotum. REferred to urology.   Final Clinical Impressions(s) / UC Diagnoses   Final diagnoses:  Scrotal mass     Discharge Instructions      Contact the Urology office below to arrange an appointment to have this mass checked.    ED Prescriptions    None    PDMP not reviewed this encounter.   Cathlyn Parsons, NP 12/09/22 1521    Cathlyn Parsons, NP 12/09/22 [phone]

## 2022-12-09 NOTE — ED Triage Notes (Addendum)
Patient reports that he has a right groin "knot" x 6 months. Patient states that sometimes it extends into his right scrotum. Patient states he feels more discomfort from the area. Now.

## 2022-12-09 NOTE — Discharge Instructions (Signed)
Contact the Urology office below to arrange an appointment to have this mass checked.

## 2022-12-11 ENCOUNTER — Emergency Department (HOSPITAL_COMMUNITY): Payer: BC Managed Care – PPO

## 2022-12-11 ENCOUNTER — Other Ambulatory Visit: Payer: Self-pay

## 2022-12-11 ENCOUNTER — Emergency Department (HOSPITAL_COMMUNITY)
Admission: EM | Admit: 2022-12-11 | Discharge: 2022-12-11 | Disposition: A | Discharge status: Home / Self Care | Payer: BC Managed Care – PPO | Attending: Emergency Medicine | Admitting: Emergency Medicine

## 2022-12-11 DIAGNOSIS — M879 Osteonecrosis, unspecified: Secondary | ICD-10-CM | POA: Diagnosis not present

## 2022-12-11 DIAGNOSIS — N200 Calculus of kidney: Secondary | ICD-10-CM | POA: Insufficient documentation

## 2022-12-11 DIAGNOSIS — M545 Low back pain, unspecified: Secondary | ICD-10-CM | POA: Diagnosis present

## 2022-12-11 LAB — I-STAT CHEM 8, ED
BUN: 10 mg/dL (ref 8–23)
Calcium, Ion: 1.26 mmol/L (ref 1.15–1.40)
Chloride: 105 mmol/L (ref 98–111)
Creatinine, Ser: 1 mg/dL (ref 0.61–1.24)
Glucose, Bld: 95 mg/dL (ref 70–99)
HCT: 44 % (ref 39.0–52.0)
Hemoglobin: 15 g/dL (ref 13.0–17.0)
Potassium: 4.4 mmol/L (ref 3.5–5.1)
Sodium: 140 mmol/L (ref 135–145)
TCO2: 27 mmol/L (ref 22–32)

## 2022-12-11 LAB — URINALYSIS, ROUTINE W REFLEX MICROSCOPIC
Bilirubin Urine: NEGATIVE
Glucose, UA: NEGATIVE mg/dL
Hgb urine dipstick: NEGATIVE
Ketones, ur: NEGATIVE mg/dL
Leukocytes,Ua: NEGATIVE
Nitrite: NEGATIVE
Protein, ur: NEGATIVE mg/dL
Specific Gravity, Urine: 1.016 (ref 1.005–1.030)
pH: 5 (ref 5.0–8.0)

## 2022-12-11 MED ORDER — KETOROLAC TROMETHAMINE 15 MG/ML IJ SOLN
15.0000 mg | Freq: Once | INTRAMUSCULAR | Status: AC
  Administered 2022-12-11: 15 mg via INTRAVENOUS
  Filled 2022-12-11: qty 1

## 2022-12-11 MED ORDER — NAPROXEN 500 MG PO TABS: 500.0000 mg | ORAL_TABLET | Freq: Two times a day (BID) | ORAL | 0 refills | Status: DC

## 2022-12-11 MED ORDER — METHOCARBAMOL 500 MG PO TABS: 500.0000 mg | ORAL_TABLET | Freq: Two times a day (BID) | ORAL | 0 refills | Status: DC

## 2022-12-11 NOTE — Discharge Instructions (Addendum)
Take naproxen as directed.  Take the Robaxin as needed.  Will make you drowsy so do not take before driving or operating machinery.  Follow-up with your primary care physician in the next 2 to 3 days.  Follow-up with sports medicine as needed for any further symptoms.  Monitor for any signs of worsening including increased pain, numbness, weakness, fevers, or inability to walk.  Return to the ED for any worsening symptoms or further concerns.

## 2022-12-11 NOTE — ED Triage Notes (Signed)
Pt arrives via POV from home with left lower back pain that started flaring up yesterday. Pt denies recent injury. Denies numbness or tingling. Pt reports pain with walking. VSS.

## 2022-12-11 NOTE — ED Provider Notes (Signed)
[place] [facility] AT [facility] Provider Note   CSN: [id number] Arrival date & time: 12/11/22  1611     History {Add pertinent medical, surgical, social history, OB history to HPI:1} Chief Complaint  Patient presents with   Back Pain    Perry Hart is a 62 y.o. male.   Back Pain      Home Medications Prior to Admission medications   Medication Sig Start Date End Date Taking? Authorizing Provider  predniSONE (STERAPRED UNI-PAK 21 TAB) 10 MG (21) TBPK tablet 6 tabs for 1 day, then 5 tabs for 1 das, then 4 tabs for 1 day, then 3 tabs for 1 day, 2 tabs for 1 day, then 1 tab for 1 day 11/08/22   Salli Quarry R, NP  tiZANidine (ZANAFLEX) 4 MG tablet Take 1 tablet (4 mg total) by mouth every 6 (six) hours as needed for muscle spasms. 11/08/22   Valinda Hoar, NP      Allergies    Patient has no known allergies.    Review of Systems   Review of Systems  Musculoskeletal:  Positive for back pain.    Physical Exam Updated Vital Signs BP 120/88 (BP Location: Right Arm)   Pulse 82   Temp 97.7 F (36.5 C) (Oral)   Resp 16   Ht 6' (1.829 m)   Wt 74.8 kg   SpO2 100%   BMI 22.38 kg/m  Physical Exam  ED Results / Procedures / Treatments   Labs (all labs ordered are listed, but only abnormal results are displayed) Labs Reviewed  URINALYSIS, ROUTINE W REFLEX MICROSCOPIC    EKG None  Radiology DG Lumbar Spine Complete  Result Date: 12/11/2022 CLINICAL DATA:  pain with movement EXAM: LUMBAR SPINE - COMPLETE 4 VIEW COMPARISON:  MR L SPine 01/11/08 FINDINGS: Five lumbar-type vertebral bodies. Vertebral body heights are maintained. There is disc space loss in the lower lumbar spine, most notably at L4-L5 and L5-S1. Lower lumbar spine predominant facet degenerative change, greatest at L4-L5 and L5-S1. Symmetric SI joints. Pelvic phleboliths. Nonobstructive bowel gas pattern. Possible small renal stone at the lower pole of the left kidney. No pars.  IMPRESSION: 1.  No acute osseous abnormality. 2. Lower lumbar spine predominant degenerative changes, greatest at L4-L5 and L5-S1. 3.  Possible small renal stone at the lower pole of the left kidney. Electronically Signed   By: Lorenza Cambridge M.D.   On: 12/11/2022 17:05    Procedures Procedures  {Document cardiac monitor, telemetry assessment procedure when appropriate:1}  Medications Ordered in ED Medications - No data to display  ED Course/ Medical Decision Making/ A&P   {   Click here for ABCD2, HEART and other calculatorsREFRESH Note before signing :1}                              Medical Decision Making Amount and/or Complexity of Data Reviewed Labs: ordered. Radiology: ordered.   ***  {Document critical care time when appropriate:1} {Document review of labs and clinical decision tools ie heart score, Chads2Vasc2 etc:1}  {Document your independent review of radiology images, and any outside records:1} {Document your discussion with family members, caretakers, and with consultants:1} {Document social determinants of health affecting pt's care:1} {Document your decision making why or why not admission, treatments were needed:1} Final Clinical Impression(s) / ED Diagnoses Final diagnoses:  None    Rx / DC Orders ED Discharge Orders  None

## 2022-12-11 NOTE — ED Provider Notes (Incomplete)
Patient is a 62 year old male presenting today with lower back pain.  Symptoms are more suggestive of lumbar strain without radicular symptoms.  Patient does have prior history of kidney stones and reports it also feels a little bit like this.  CT was negative for renal stone today except for a stone in the kidney and which is not causing his pain.  Neurologically intact at this time.  No indication for MRI.  Patient with supportive care and discharge home.  Follow-up provided.

## 2023-01-26 NOTE — Therapy (Signed)
OUTPATIENT PHYSICAL THERAPY THORACOLUMBAR EVALUATION   Patient Name: Perry Hart MRN: [id number] DOB:Oct 07, 1960, 62 y.o., male Today's Date: 01/27/2023  END OF SESSION:  PT End of Session - 01/27/23 1027     Visit Number 1    Number of Visits 12    Date for PT Re-Evaluation 03/29/23    Authorization Type [employer]    PT Start Time 0830    PT Stop Time 0915    PT Time Calculation (min) 45 min    Activity Tolerance Patient tolerated treatment well    Behavior During Therapy WFL for tasks assessed/performed             Past Medical History:  Diagnosis Date   Kidney stones    Renal disorder    Past Surgical History:  Procedure Laterality Date   BACK SURGERY     cervical dissectomy     SPLENECTOMY     There are no problems to display for this patient.   PCP: Patient, No Pcp Per   REFERRING PROVIDER: Currence, Vladimir Crofts, PA-C  REFERRING DIAG: M54.16 (ICD-10-CM) - Radiculopathy, lumbar region M54.31 (ICD-10-CM) - Sciatica, right side  Rationale for Evaluation and Treatment: Rehabilitation  THERAPY DIAG:  Sciatica, right side  Muscle weakness (generalized)  Other low back pain  ONSET DATE: 12/11/22  SUBJECTIVE:                                                                                                                                                                                           SUBJECTIVE STATEMENT: Seen by [place] 12/11/22. Stated the pain radiates down the back of the leg to the foot. Any OTC's taken do not help. Tried exercises to see if it would help. Has to shift position when seated. Medication issued by ER only took edge off. But the pain has not resolved. More painful walking an incline, painful to get into the car and drive, move to put pressure on petals. Pain with prolonged sitting and walking, stair climbing.  Better with position changes but relief is temporary.  PERTINENT HISTORY:  History of Present Illness: Perry Hart low back  pain. He complains of right buttocks pain that is rating down his right lower leg. All the way into the foot. He does complain of numbness and tingling and burning like pain. No recent injury or trauma. He states is been flared up for about 1 week. He did go to the emergency room for this about a month ago for similar type thing. Current medication was seem to help some but is actually slowly worsened. He is a very avid runner and is  actually run multiple marathons and half marathons previously. Currently he is not able to do that. He states he stopped running and about the the start of COVID. No red flag symptoms.   PAIN:  Are you having pain? Yes: NPRS scale: 10/10 Pain location: R hip and LE Pain description: ache cramp Aggravating factors: prolonged sitting and walking, arising from chair ascending stairs Relieving factors: position changes, activity  PRECAUTIONS: None  RED FLAGS: None   WEIGHT BEARING RESTRICTIONS: No  FALLS:  Has patient fallen in last 6 months? No  OCCUPATION: maintenance   PLOF: Independent  PATIENT GOALS: To resolve my symptoms  NEXT MD VISIT: as needed  OBJECTIVE:   DIAGNOSTIC FINDINGS:  EXAM: LUMBAR SPINE - COMPLETE 4 VIEW   COMPARISON:  MR L SPine 01/11/08   FINDINGS: Five lumbar-type vertebral bodies. Vertebral body heights are maintained. There is disc space loss in the lower lumbar spine, most notably at L4-L5 and L5-S1. Lower lumbar spine predominant facet degenerative change, greatest at L4-L5 and L5-S1. Symmetric SI joints. Pelvic phleboliths. Nonobstructive bowel gas pattern. Possible small renal stone at the lower pole of the left kidney. No pars.   IMPRESSION: 1.  No acute osseous abnormality.   2. Lower lumbar spine predominant degenerative changes, greatest at L4-L5 and L5-S1.   3.  Possible small renal stone at the lower pole of the left kidney.     Electronically Signed   By: Lorenza Cambridge M.D.   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  POSTURE: No Significant postural limitations  PALPATION: R piriformis unremarkable  LUMBAR ROM:   AROM eval  Flexion 90%  Extension 25%  Right lateral flexion 90%  Left lateral flexion 75%  Right rotation   Left rotation    (Blank rows = not tested)  LOWER EXTREMITY ROM:   WFL B  Active  Right eval Left eval  Hip flexion    Hip extension    Hip abduction    Hip adduction    Hip internal rotation    Hip external rotation    Knee flexion    Knee extension    Ankle dorsiflexion    Ankle plantarflexion    Ankle inversion    Ankle eversion     (Blank rows = not tested)  LOWER EXTREMITY MMT:    MMT Right eval Left eval  Hip flexion    Hip extension    Hip abduction    Hip adduction    Hip internal rotation    Hip external rotation    Knee flexion    Knee extension    Ankle dorsiflexion    Ankle plantarflexion    Ankle inversion    Ankle eversion    Trunk/core 4- 4-   (Blank rows = not tested)  LUMBAR SPECIAL TESTS:  Straight leg raise test: Positive, Slump test: Positive, FABER test: Negative, and R piriformis test negative  FUNCTIONAL TESTS:  5 times sit to stand: 13s  GAIT: Distance walked: 71ftx2 Assistive device utilized: None Level of assistance: Complete Independence Comments: slight antalgic gait with   TODAY'S TREATMENT:  DATE: 01/27/23 Eval and HEP    PATIENT EDUCATION:  Education details: Discussed eval findings, rehab rationale and POC and patient is in agreement  Person educated: Patient Education method: Explanation Education comprehension: verbalized understanding and needs further education  HOME EXERCISE PROGRAM: Access Code: [email or web address] URL:  [email or web address] Date: 01/27/2023 Prepared by: Gustavus Bryant  Exercises - Seated Table Hamstring Stretch  - 3 x daily - 5 x weekly - 1 sets - 2 reps - 30s hold - Hip Flexor Stretch at Edge of Bed  - 3 x daily - 5 x weekly - 1 sets - 2 reps - 30s hold - Static Prone on Elbows  - 3 x daily - 5 x weekly - 1 sets - 1 reps - 2 min hold  ASSESSMENT:  CLINICAL IMPRESSION: Patient is a 62 y.o. male who was seen today for physical therapy evaluation and treatment for R hip pain and sciatica. Patient presents with positive R slump and SLR indicating neural tension.  AROM of trunk markedly limited into extension.  Spring testing of lumbar spine finds limited intersegmental mobility.  5x STS time functional.  Tight hip flexors identified as well R>L.  Patient is a good candidate for OPPT to improve mobility and RLE symptoms.  OBJECTIVE IMPAIRMENTS: Abnormal gait, decreased activity tolerance, decreased knowledge of condition, decreased mobility, difficulty walking, increased fascial restrictions, increased muscle spasms, improper body mechanics, and pain.   ACTIVITY LIMITATIONS: carrying, lifting, sitting, standing, squatting, and stairs  PERSONAL FACTORS: Age and Fitness are also affecting patient's functional outcome.   REHAB POTENTIAL: Good  CLINICAL DECISION MAKING: Stable/uncomplicated  EVALUATION COMPLEXITY: Low   GOALS: Goals reviewed with patient? No  SHORT TERM GOALS=LONG TERM GOALS: Target date:   Patient to demonstrate independence in HEP  Baseline: [id number] Goal status: INITIAL  2.  4/5 trunk strength  Baseline: 4-/5 trunk strength Goal status: INITIAL  3.  Increase trunk extension to 50% Baseline:  AROM eval  Flexion 90%  Extension 25%  Right lateral flexion 90%  Left lateral flexion 75%   Goal status: INITIAL  4.  6/10 worst pain Baseline: 10/10 Goal status: INITIAL  5.  Patient will score at least 56% on FOTO to signify clinically  meaningful improvement in functional abilities.   Baseline: 32 Goal status: INITIAL     PLAN:  PT FREQUENCY: 1-2x/week  PT DURATION: 6 weeks  PLANNED INTERVENTIONS: Therapeutic exercises, Therapeutic activity, Neuromuscular re-education, Balance training, Gait training, Patient/Family education, Self Care, Joint mobilization, Stair training, Aquatic Therapy, Dry Needling, Spinal mobilization, Cryotherapy, Moist heat, Manual therapy, and Re-evaluation.  PLAN FOR NEXT SESSION: HEP review and update, manual techniques as appropriate, aerobic tasks, ROM and flexibility activities, strengthening and PREs, TPDN, gait and balance training as needed     Hildred Laser, PT 01/27/2023, 10:35 AM

## 2023-01-27 ENCOUNTER — Ambulatory Visit: Payer: BC Managed Care – PPO | Attending: Physician Assistant

## 2023-01-27 DIAGNOSIS — M5431 Sciatica, right side: Secondary | ICD-10-CM | POA: Diagnosis present

## 2023-01-27 DIAGNOSIS — M6281 Muscle weakness (generalized): Secondary | ICD-10-CM | POA: Insufficient documentation

## 2023-01-27 DIAGNOSIS — M5459 Other low back pain: Secondary | ICD-10-CM | POA: Insufficient documentation

## 2023-02-02 ENCOUNTER — Ambulatory Visit: Payer: BC Managed Care – PPO

## 2023-02-02 DIAGNOSIS — M5431 Sciatica, right side: Secondary | ICD-10-CM

## 2023-02-02 DIAGNOSIS — M6281 Muscle weakness (generalized): Secondary | ICD-10-CM

## 2023-02-02 DIAGNOSIS — M5459 Other low back pain: Secondary | ICD-10-CM

## 2023-02-02 NOTE — Therapy (Signed)
OUTPATIENT PHYSICAL THERAPY TREATMENT NOTE   Patient Name: Perry Hart MRN: [id number] DOB:08/31/1960, 62 y.o., male Today's Date: 02/02/2023  END OF SESSION:  PT End of Session - 02/02/23 1258     Visit Number 2    Number of Visits 12    Date for PT Re-Evaluation 03/29/23    Authorization Type [employer]    PT Start Time 1300    PT Stop Time 1345    PT Time Calculation (min) 45 min    Activity Tolerance Patient tolerated treatment well    Behavior During Therapy WFL for tasks assessed/performed             Past Medical History:  Diagnosis Date   Kidney stones    Renal disorder    Past Surgical History:  Procedure Laterality Date   BACK SURGERY     cervical dissectomy     SPLENECTOMY     There are no problems to display for this patient.   PCP: Patient, No Pcp Per   REFERRING PROVIDER: Currence, Perry Crofts, PA-C  REFERRING DIAG: M54.16 (ICD-10-CM) - Radiculopathy, lumbar region M54.31 (ICD-10-CM) - Sciatica, right side  Rationale for Evaluation and Treatment: Rehabilitation  THERAPY DIAG:  Sciatica, right side  Muscle weakness (generalized)  Other low back pain  ONSET DATE: 12/11/22  SUBJECTIVE:                                                                                                                                                                                           SUBJECTIVE STATEMENT: No changes since first session.  Has been compliant with HEP and felt symptoms have subsided a bit.   Seen by [place] 12/11/22. Stated the pain radiates down the back of the leg to the foot. Any OTC's taken do not help. Tried exercises to see if it would help. Has to shift position when seated. Medication issued by ER only took edge off. But the pain has not resolved. More painful walking an incline, painful to get into the car and drive, move to put pressure on petals. Pain with prolonged sitting and walking, stair climbing.  Better with position changes but relief  is temporary.  PERTINENT HISTORY:  History of Present Illness: Perry Hart low back pain. He complains of right buttocks pain that is rating down his right lower leg. All the way into the foot. He does complain of numbness and tingling and burning like pain. No recent injury or trauma. He states is been flared up for about 1 week. He did go to the emergency room for this about a month ago for similar type thing.  Current medication was seem to help some but is actually slowly worsened. He is a very avid runner and is actually run multiple marathons and half marathons previously. Currently he is not able to do that. He states he stopped running and about the the start of COVID. No red flag symptoms.   PAIN:  Are you having pain? Yes: NPRS scale: 10/10 Pain location: R hip and LE Pain description: ache cramp Aggravating factors: prolonged sitting and walking, arising from chair ascending stairs Relieving factors: position changes, activity  PRECAUTIONS: None  RED FLAGS: None   WEIGHT BEARING RESTRICTIONS: No  FALLS:  Has patient fallen in last 6 months? No  OCCUPATION: maintenance   PLOF: Independent  PATIENT GOALS: To resolve my symptoms  NEXT MD VISIT: as needed  OBJECTIVE:   DIAGNOSTIC FINDINGS:  EXAM: LUMBAR SPINE - COMPLETE 4 VIEW   COMPARISON:  MR L SPine 01/11/08   FINDINGS: Five lumbar-type vertebral bodies. Vertebral body heights are maintained. There is disc space loss in the lower lumbar spine, most notably at L4-L5 and L5-S1. Lower lumbar spine predominant facet degenerative change, greatest at L4-L5 and L5-S1. Symmetric SI joints. Pelvic phleboliths. Nonobstructive bowel gas pattern. Possible small renal stone at the lower pole of the left kidney. No pars.   IMPRESSION: 1.  No acute osseous abnormality.   2. Lower lumbar spine predominant degenerative changes, greatest at L4-L5 and L5-S1.   3.  Possible small renal stone at the lower pole of the left  kidney.     Electronically Signed   By: Perry Hart M.D.   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  POSTURE: No Significant postural limitations  PALPATION: R piriformis unremarkable  LUMBAR ROM:   AROM eval  Flexion 90%  Extension 25%  Right lateral flexion 90%  Left lateral flexion 75%  Right rotation   Left rotation    (Blank rows = not tested)  LOWER EXTREMITY ROM:   WFL B  Active  Right eval Left eval  Hip flexion    Hip extension    Hip abduction    Hip adduction    Hip internal rotation    Hip external rotation    Knee flexion    Knee extension    Ankle dorsiflexion    Ankle plantarflexion    Ankle inversion    Ankle eversion     (Blank rows = not tested)  LOWER EXTREMITY MMT:    MMT Right eval Left eval  Hip flexion    Hip extension    Hip abduction    Hip adduction    Hip internal rotation    Hip external rotation    Knee flexion    Knee extension    Ankle dorsiflexion    Ankle plantarflexion    Ankle inversion    Ankle eversion    Trunk/core 4- 4-   (Blank rows = not tested)  LUMBAR SPECIAL TESTS:  Straight leg raise test: Positive, Slump test: Positive, FABER test: Negative, and R piriformis test negative  FUNCTIONAL TESTS:  5 times sit to stand: 13s  GAIT: Distance walked: 32ftx2 Assistive device utilized: None Level of assistance: Complete Independence Comments: slight antalgic gait with   TODAY'S TREATMENT:       [facility] Adult PT Treatment:  DATE: 02/01/23 Therapeutic Exercise: Nustep L4 8 min Seated hamstring stretch 30sx2 B QL stretch 30s x2 B PPT 3s  PPT with alt march 10/10 2# Curl ups with p-ball 15x, unilateral 15/15 Supine hip fallouts GTB 15x B, 15/15 unilateral Bridge against GTB 15x Bridge with ball squeeze 15x PA mobs L5-1 2x10 Prone press 10x f/b 10x  with PT OP                                                                                                                          DATE: 01/27/23 Eval and HEP    PATIENT EDUCATION:  Education details: Discussed eval findings, rehab rationale and POC and patient is in agreement  Person educated: Patient Education method: Explanation Education comprehension: verbalized understanding and needs further education  HOME EXERCISE PROGRAM: Access Code: [email or web address] URL: [email or web address] Date: 02/02/2023 Prepared by: Gustavus Bryant  Exercises - Seated Table Hamstring Stretch  - 3 x daily - 5 x weekly - 1 sets - 2 reps - 30s hold - Hip Flexor Stretch at Edge of Bed  - 3 x daily - 5 x weekly - 1 sets - 2 reps - 30s hold - Prone Press Up  - 3 x daily - 5 x weekly - 1 sets - 10 reps - 3s hold  ASSESSMENT:  CLINICAL IMPRESSION: First f/u session focused on core strengthening and regaining lumbar extension ROM.  Incorporated abdominal work, stretching and manual techniques to regain mobility.  Much improved trunk extension at end of session w/o any increase in soreness or discomfort reported.  Patient is a 62 y.o. male who was seen today for physical therapy evaluation and treatment for R hip pain and sciatica. Patient presents with positive R slump and SLR indicating neural tension.  AROM of trunk markedly limited into extension.  Spring testing of lumbar spine finds limited intersegmental mobility.  5x STS time functional.  Tight hip flexors identified as well R>L.  Patient is a good candidate for OPPT to improve mobility and RLE symptoms.  OBJECTIVE IMPAIRMENTS: Abnormal gait, decreased activity tolerance, decreased knowledge of condition, decreased mobility, difficulty walking, increased fascial restrictions, increased muscle spasms, improper body mechanics, and pain.   ACTIVITY LIMITATIONS: carrying, lifting, sitting, standing, squatting, and stairs  PERSONAL FACTORS: Age and  Fitness are also affecting patient's functional outcome.   REHAB POTENTIAL: Good  CLINICAL DECISION MAKING: Stable/uncomplicated  EVALUATION COMPLEXITY: Low   GOALS: Goals reviewed with patient? No  SHORT TERM GOALS=LONG TERM GOALS: Target date:   Patient to demonstrate independence in HEP  Baseline: [id number] Goal status: INITIAL  2.  4/5 trunk strength  Baseline: 4-/5 trunk strength Goal status: INITIAL  3.  Increase trunk extension to 50% Baseline:  AROM eval  Flexion 90%  Extension 25%  Right lateral flexion 90%  Left lateral flexion 75%   Goal status: INITIAL  4.  6/10 worst pain Baseline: 10/10 Goal status: INITIAL  5.  Patient will score at least 56% on  FOTO to signify clinically meaningful improvement in functional abilities.   Baseline: 32 Goal status: INITIAL     PLAN:  PT FREQUENCY: 1-2x/week  PT DURATION: 6 weeks  PLANNED INTERVENTIONS: Therapeutic exercises, Therapeutic activity, Neuromuscular re-education, Balance training, Gait training, Patient/Family education, Self Care, Joint mobilization, Stair training, Aquatic Therapy, Dry Needling, Spinal mobilization, Cryotherapy, Moist heat, Manual therapy, and Re-evaluation.  PLAN FOR NEXT SESSION: HEP review and update, manual techniques as appropriate, aerobic tasks, ROM and flexibility activities, strengthening and PREs, TPDN, gait and balance training as needed     Hildred Laser, PT 02/02/2023, 1:49 PM

## 2023-02-10 NOTE — Therapy (Signed)
OUTPATIENT PHYSICAL THERAPY TREATMENT NOTE   Patient Name: Perry Hart MRN: [id number] DOB:1960/11/23, 62 y.o., male Today's Date: 02/11/2023  END OF SESSION:  PT End of Session - 02/11/23 0814     Visit Number 3    Number of Visits 12    Date for PT Re-Evaluation 03/29/23    Authorization Type BCBS    PT Start Time 0815    PT Stop Time 0855    PT Time Calculation (min) 40 min    Activity Tolerance Patient tolerated treatment well    Behavior During Therapy WFL for tasks assessed/performed              Past Medical History:  Diagnosis Date   Kidney stones    Renal disorder    Past Surgical History:  Procedure Laterality Date   BACK SURGERY     cervical dissectomy     SPLENECTOMY     There are no problems to display for this patient.   PCP: Patient, No Pcp Per   REFERRING PROVIDER: Currence, Vladimir Crofts, PA-C  REFERRING DIAG: M54.16 (ICD-10-CM) - Radiculopathy, lumbar region M54.31 (ICD-10-CM) - Sciatica, right side  Rationale for Evaluation and Treatment: Rehabilitation  THERAPY DIAG:  Sciatica, right side  Muscle weakness (generalized)  Other low back pain  ONSET DATE: 12/11/22  SUBJECTIVE:                                                                                                                                                                                           SUBJECTIVE STATEMENT: Patient reports that his pain is mild in his Rt hip today, has been somewhat compliant with his HEP.    Seen by [place] 12/11/22. Stated the pain radiates down the back of the leg to the foot. Any OTC's taken do not help. Tried exercises to see if it would help. Has to shift position when seated. Medication issued by ER only took edge off. But the pain has not resolved. More painful walking an incline, painful to get into the car and drive, move to put pressure on petals. Pain with prolonged sitting and walking, stair climbing.  Better with position changes but  relief is temporary.  PERTINENT HISTORY:  History of Present Illness: Perry Hart low back pain. He complains of right buttocks pain that is rating down his right lower leg. All the way into the foot. He does complain of numbness and tingling and burning like pain. No recent injury or trauma. He states is been flared up for about 1 week. He did go to the emergency room for this about a month ago for  similar type thing. Current medication was seem to help some but is actually slowly worsened. He is a very avid runner and is actually run multiple marathons and half marathons previously. Currently he is not able to do that. He states he stopped running and about the the start of COVID. No red flag symptoms.   PAIN:  Are you having pain? Yes: NPRS scale: 10/10 Pain location: R hip and LE Pain description: ache cramp Aggravating factors: prolonged sitting and walking, arising from chair ascending stairs Relieving factors: position changes, activity  PRECAUTIONS: None  RED FLAGS: None   WEIGHT BEARING RESTRICTIONS: No  FALLS:  Has patient fallen in last 6 months? No  OCCUPATION: maintenance   PLOF: Independent  PATIENT GOALS: To resolve my symptoms  NEXT MD VISIT: as needed  OBJECTIVE:   DIAGNOSTIC FINDINGS:  EXAM: LUMBAR SPINE - COMPLETE 4 VIEW   COMPARISON:  MR L SPine 01/11/08   FINDINGS: Five lumbar-type vertebral bodies. Vertebral body heights are maintained. There is disc space loss in the lower lumbar spine, most notably at L4-L5 and L5-S1. Lower lumbar spine predominant facet degenerative change, greatest at L4-L5 and L5-S1. Symmetric SI joints. Pelvic phleboliths. Nonobstructive bowel gas pattern. Possible small renal stone at the lower pole of the left kidney. No pars.   IMPRESSION: 1.  No acute osseous abnormality.   2. Lower lumbar spine predominant degenerative changes, greatest at L4-L5 and L5-S1.   3.  Possible small renal stone at the lower pole of  the left kidney.     Electronically Signed   By: Perry Hart M.D.   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  POSTURE: No Significant postural limitations  PALPATION: R piriformis unremarkable  LUMBAR ROM:   AROM eval  Flexion 90%  Extension 25%  Right lateral flexion 90%  Left lateral flexion 75%  Right rotation   Left rotation    (Blank rows = not tested)  LOWER EXTREMITY ROM:   WFL B  Active  Right eval Left eval  Hip flexion    Hip extension    Hip abduction    Hip adduction    Hip internal rotation    Hip external rotation    Knee flexion    Knee extension    Ankle dorsiflexion    Ankle plantarflexion    Ankle inversion    Ankle eversion     (Blank rows = not tested)  LOWER EXTREMITY MMT:    MMT Right eval Left eval  Hip flexion    Hip extension    Hip abduction    Hip adduction    Hip internal rotation    Hip external rotation    Knee flexion    Knee extension    Ankle dorsiflexion    Ankle plantarflexion    Ankle inversion    Ankle eversion    Trunk/core 4- 4-   (Blank rows = not tested)  LUMBAR SPECIAL TESTS:  Straight leg raise test: Positive, Slump test: Positive, FABER test: Negative, and R piriformis test negative  FUNCTIONAL TESTS:  5 times sit to stand: 13s  GAIT: Distance walked: 68ftx2 Assistive device utilized: None Level of assistance: Complete Independence Comments: slight antalgic gait with   TODAY'S TREATMENT:    [facility] Adult PT Treatment:  DATE: 02/11/23 Therapeutic Exercise: Nustep L4 8 min Seated hamstring stretch 30sx2 B QL stretch 30s x2 B Supine hip fallouts GTB 15x B, 15/15 unilateral Bridge against GTB 15x Bridge with ball squeeze 15x Prone press x10 Seated pball roll outs fwd/lat x10 ea SLR with contralateral pilates ring push 2x10 BIL Supine sciatic  nerve glides x20 BIL      [facility] Adult PT Treatment:                                                DATE: 02/01/23 Therapeutic Exercise: Nustep L4 8 min Seated hamstring stretch 30sx2 B QL stretch 30s x2 B PPT 3s  PPT with alt march 10/10 2# Curl ups with p-ball 15x, unilateral 15/15 Supine hip fallouts GTB 15x B, 15/15 unilateral Bridge against GTB 15x Bridge with ball squeeze 15x PA mobs L5-1 2x10 Prone press 10x f/b 10x with PT OP                                                                                                                          DATE: 01/27/23 Eval and HEP    PATIENT EDUCATION:  Education details: Discussed eval findings, rehab rationale and POC and patient is in agreement  Person educated: Patient Education method: Explanation Education comprehension: verbalized understanding and needs further education  HOME EXERCISE PROGRAM: Access Code: [email or web address] URL: [email or web address] Date: 02/02/2023 Prepared by: Gustavus Bryant  Exercises - Seated Table Hamstring Stretch  - 3 x daily - 5 x weekly - 1 sets - 2 reps - 30s hold - Hip Flexor Stretch at Edge of Bed  - 3 x daily - 5 x weekly - 1 sets - 2 reps - 30s hold - Prone Press Up  - 3 x daily - 5 x weekly - 1 sets - 10 reps - 3s hold  ASSESSMENT:  CLINICAL IMPRESSION:  Patient presents to PT reporting minimal pain in his Rt hip today and that he has been somewhat compliant with his HEP. Session today continued to focus on proximal hip and core strengthening as well as lumbar mobility exercises to reduce pain and improve functional independence. Patient was able to tolerate all prescribed exercises with no adverse effects. Patient continues to benefit from skilled PT services and should be progressed as able to improve functional independence.   Patient is a 62 y.o. male who was seen today for physical therapy evaluation and treatment for R hip pain and sciatica. Patient presents with positive R  slump and SLR indicating neural tension.  AROM of trunk markedly limited into extension.  Spring testing of lumbar spine finds limited intersegmental mobility.  5x STS time functional.  Tight hip flexors identified as well R>L.  Patient is a good candidate for OPPT to improve mobility and RLE symptoms.  OBJECTIVE IMPAIRMENTS: Abnormal gait, decreased activity tolerance, decreased  knowledge of condition, decreased mobility, difficulty walking, increased fascial restrictions, increased muscle spasms, improper body mechanics, and pain.   ACTIVITY LIMITATIONS: carrying, lifting, sitting, standing, squatting, and stairs  PERSONAL FACTORS: Age and Fitness are also affecting patient's functional outcome.   REHAB POTENTIAL: Good  CLINICAL DECISION MAKING: Stable/uncomplicated  EVALUATION COMPLEXITY: Low   GOALS: Goals reviewed with patient? No  SHORT TERM GOALS=LONG TERM GOALS: Target date:   Patient to demonstrate independence in HEP  Baseline: [id number] Goal status: INITIAL  2.  4/5 trunk strength  Baseline: 4-/5 trunk strength Goal status: INITIAL  3.  Increase trunk extension to 50% Baseline:  AROM eval  Flexion 90%  Extension 25%  Right lateral flexion 90%  Left lateral flexion 75%   Goal status: INITIAL  4.  6/10 worst pain Baseline: 10/10 Goal status: INITIAL  5.  Patient will score at least 56% on FOTO to signify clinically meaningful improvement in functional abilities.   Baseline: 32 Goal status: INITIAL     PLAN:  PT FREQUENCY: 1-2x/week  PT DURATION: 6 weeks  PLANNED INTERVENTIONS: Therapeutic exercises, Therapeutic activity, Neuromuscular re-education, Balance training, Gait training, Patient/Family education, Self Care, Joint mobilization, Stair training, Aquatic Therapy, Dry Needling, Spinal mobilization, Cryotherapy, Moist heat, Manual therapy, and Re-evaluation.  PLAN FOR NEXT SESSION: HEP review and update, manual techniques as appropriate, aerobic  tasks, ROM and flexibility activities, strengthening and PREs, TPDN, gait and balance training as needed     Chandan Fly, PTA 02/11/2023, 8:53 AM

## 2023-02-11 ENCOUNTER — Ambulatory Visit: Payer: BC Managed Care – PPO | Attending: Physician Assistant

## 2023-02-11 DIAGNOSIS — M5459 Other low back pain: Secondary | ICD-10-CM | POA: Insufficient documentation

## 2023-02-11 DIAGNOSIS — M6281 Muscle weakness (generalized): Secondary | ICD-10-CM | POA: Diagnosis present

## 2023-02-11 DIAGNOSIS — M5431 Sciatica, right side: Secondary | ICD-10-CM | POA: Insufficient documentation

## 2023-02-14 ENCOUNTER — Ambulatory Visit: Payer: BC Managed Care – PPO

## 2023-02-14 DIAGNOSIS — M6281 Muscle weakness (generalized): Secondary | ICD-10-CM

## 2023-02-14 DIAGNOSIS — M5459 Other low back pain: Secondary | ICD-10-CM

## 2023-02-14 DIAGNOSIS — M5431 Sciatica, right side: Secondary | ICD-10-CM

## 2023-02-14 NOTE — Therapy (Signed)
OUTPATIENT PHYSICAL THERAPY TREATMENT NOTE   Patient Name: Perry Hart MRN: [id number] DOB:May 04, 1961, 62 y.o., male Today's Date: 02/14/2023  END OF SESSION:  PT End of Session - 02/14/23 0823     Visit Number 4    Number of Visits 12    Date for PT Re-Evaluation 03/29/23    Authorization Type BCBS    PT Start Time 0830    PT Stop Time 0908    PT Time Calculation (min) 38 min    Activity Tolerance Patient tolerated treatment well    Behavior During Therapy WFL for tasks assessed/performed               Past Medical History:  Diagnosis Date   Kidney stones    Renal disorder    Past Surgical History:  Procedure Laterality Date   BACK SURGERY     cervical dissectomy     SPLENECTOMY     There are no problems to display for this patient.   PCP: Patient, No Pcp Per   REFERRING PROVIDER: Currence, Perry Crofts, PA-C  REFERRING DIAG: M54.16 (ICD-10-CM) - Radiculopathy, lumbar region M54.31 (ICD-10-CM) - Sciatica, right side  Rationale for Evaluation and Treatment: Rehabilitation  THERAPY DIAG:  Sciatica, right side  Muscle weakness (generalized)  Other low back pain  ONSET DATE: 12/11/22  SUBJECTIVE:                                                                                                                                                                                           SUBJECTIVE STATEMENT: Patient reports soreness in the rt side of his lower back/hip and that he has been compliant with his HEP.   Seen by [place] 12/11/22. Stated the pain radiates down the back of the leg to the foot. Any OTC's taken do not help. Tried exercises to see if it would help. Has to shift position when seated. Medication issued by ER only took edge off. But the pain has not resolved. More painful walking an incline, painful to get into the car and drive, move to put pressure on petals. Pain with prolonged sitting and walking, stair climbing.  Better with position  changes but relief is temporary.  PERTINENT HISTORY:  History of Present Illness: Perry Hart low back pain. He complains of right buttocks pain that is rating down his right lower leg. All the way into the foot. He does complain of numbness and tingling and burning like pain. No recent injury or trauma. He states is been flared up for about 1 week. He did go to the emergency room for this about a month ago  for similar type thing. Current medication was seem to help some but is actually slowly worsened. He is a very avid runner and is actually run multiple marathons and half marathons previously. Currently he is not able to do that. He states he stopped running and about the the start of COVID. No red flag symptoms.   PAIN:  Are you having pain? Yes: NPRS scale: 10/10 Pain location: R hip and LE Pain description: ache cramp Aggravating factors: prolonged sitting and walking, arising from chair ascending stairs Relieving factors: position changes, activity  PRECAUTIONS: None  RED FLAGS: None   WEIGHT BEARING RESTRICTIONS: No  FALLS:  Has patient fallen in last 6 months? No  OCCUPATION: maintenance   PLOF: Independent  PATIENT GOALS: To resolve my symptoms  NEXT MD VISIT: as needed  OBJECTIVE:   DIAGNOSTIC FINDINGS:  EXAM: LUMBAR SPINE - COMPLETE 4 VIEW   COMPARISON:  MR L SPine 01/11/08   FINDINGS: Five lumbar-type vertebral bodies. Vertebral body heights are maintained. There is disc space loss in the lower lumbar spine, most notably at L4-L5 and L5-S1. Lower lumbar spine predominant facet degenerative change, greatest at L4-L5 and L5-S1. Symmetric SI joints. Pelvic phleboliths. Nonobstructive bowel gas pattern. Possible small renal stone at the lower pole of the left kidney. No pars.   IMPRESSION: 1.  No acute osseous abnormality.   2. Lower lumbar spine predominant degenerative changes, greatest at L4-L5 and L5-S1.   3.  Possible small renal stone at the  lower pole of the left kidney.     Electronically Signed   By: Perry Hart M.D.   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  POSTURE: No Significant postural limitations  PALPATION: R piriformis unremarkable  LUMBAR ROM:   AROM eval  Flexion 90%  Extension 25%  Right lateral flexion 90%  Left lateral flexion 75%  Right rotation   Left rotation    (Blank rows = not tested)  LOWER EXTREMITY ROM:   WFL B  Active  Right eval Left eval  Hip flexion    Hip extension    Hip abduction    Hip adduction    Hip internal rotation    Hip external rotation    Knee flexion    Knee extension    Ankle dorsiflexion    Ankle plantarflexion    Ankle inversion    Ankle eversion     (Blank rows = not tested)  LOWER EXTREMITY MMT:    MMT Right eval Left eval  Hip flexion    Hip extension    Hip abduction    Hip adduction    Hip internal rotation    Hip external rotation    Knee flexion    Knee extension    Ankle dorsiflexion    Ankle plantarflexion    Ankle inversion    Ankle eversion    Trunk/core 4- 4-   (Blank rows = not tested)  LUMBAR SPECIAL TESTS:  Straight leg raise test: Positive, Slump test: Positive, FABER test: Negative, and R piriformis test negative  FUNCTIONAL TESTS:  5 times sit to stand: 13s  GAIT: Distance walked: 64ftx2 Assistive device utilized: None Level of assistance: Complete Independence Comments: slight antalgic gait with   TODAY'S TREATMENT:   [facility] Adult PT Treatment:  DATE: 02/14/23 Therapeutic Exercise: Nustep L5 8 min Seated hamstring stretch 30sx2 B QL stretch 30s x2 B Supine hip adduction squeeze 5" hold 2x10 Supine hip fallouts GTB 15x B, 15/15 unilateral Bridge against GTB 2x10 Bridge with ball squeeze 2x10 SLR with contralateral pilates ring push 2x10 BIL Supine  sciatic nerve glides x20 BIL Prone press x10 Seated pball roll outs fwd/lat x10 ea    [facility] Adult PT Treatment:                                                DATE: 02/11/23 Therapeutic Exercise: Nustep L4 8 min Seated hamstring stretch 30sx2 B QL stretch 30s x2 B Supine hip fallouts GTB 15x B, 15/15 unilateral Bridge against GTB 15x Bridge with ball squeeze 15x Prone press x10 Seated pball roll outs fwd/lat x10 ea SLR with contralateral pilates ring push 2x10 BIL Supine sciatic nerve glides x20 BIL      [facility] Adult PT Treatment:                                                DATE: 02/01/23 Therapeutic Exercise: Nustep L4 8 min Seated hamstring stretch 30sx2 B QL stretch 30s x2 B PPT 3s  PPT with alt march 10/10 2# Curl ups with p-ball 15x, unilateral 15/15 Supine hip fallouts GTB 15x B, 15/15 unilateral Bridge against GTB 15x Bridge with ball squeeze 15x PA mobs L5-1 2x10 Prone press 10x f/b 10x with PT OP                                                                                                                           PATIENT EDUCATION:  Education details: Discussed eval findings, rehab rationale and POC and patient is in agreement  Person educated: Patient Education method: Explanation Education comprehension: verbalized understanding and needs further education  HOME EXERCISE PROGRAM: Access Code: [email or web address] URL: [email or web address] Date: 02/02/2023 Prepared by: Gustavus Bryant  Exercises - Seated Table Hamstring Stretch  - 3 x daily - 5 x weekly - 1 sets - 2 reps - 30s hold - Hip Flexor Stretch at Edge of Bed  - 3 x daily - 5 x weekly - 1 sets - 2 reps - 30s hold - Prone Press Up  - 3 x daily - 5 x weekly - 1 sets - 10 reps - 3s hold  ASSESSMENT:  CLINICAL IMPRESSION:  Patient presents to PT reporting minimal current pain and that he has been compliant with his HEP. Session today continued to focus on proximal hip strengthening as well as  lumbar mobility. Patient was able to tolerate all prescribed exercises with no adverse effects. Patient continues to benefit from skilled PT services and  should be progressed as able to improve functional independence.     Patient is a 62 y.o. male who was seen today for physical therapy evaluation and treatment for R hip pain and sciatica. Patient presents with positive R slump and SLR indicating neural tension.  AROM of trunk markedly limited into extension.  Spring testing of lumbar spine finds limited intersegmental mobility.  5x STS time functional.  Tight hip flexors identified as well R>L.  Patient is a good candidate for OPPT to improve mobility and RLE symptoms.  OBJECTIVE IMPAIRMENTS: Abnormal gait, decreased activity tolerance, decreased knowledge of condition, decreased mobility, difficulty walking, increased fascial restrictions, increased muscle spasms, improper body mechanics, and pain.   ACTIVITY LIMITATIONS: carrying, lifting, sitting, standing, squatting, and stairs  PERSONAL FACTORS: Age and Fitness are also affecting patient's functional outcome.   REHAB POTENTIAL: Good  CLINICAL DECISION MAKING: Stable/uncomplicated  EVALUATION COMPLEXITY: Low   GOALS: Goals reviewed with patient? No  SHORT TERM GOALS=LONG TERM GOALS: Target date:   Patient to demonstrate independence in HEP  Baseline: [id number] Goal status: INITIAL  2.  4/5 trunk strength  Baseline: 4-/5 trunk strength Goal status: INITIAL  3.  Increase trunk extension to 50% Baseline:  AROM eval  Flexion 90%  Extension 25%  Right lateral flexion 90%  Left lateral flexion 75%   Goal status: INITIAL  4.  6/10 worst pain Baseline: 10/10 Goal status: INITIAL  5.  Patient will score at least 56% on FOTO to signify clinically meaningful improvement in functional abilities.   Baseline: 32 Goal status: INITIAL     PLAN:  PT FREQUENCY: 1-2x/week  PT DURATION: 6 weeks  PLANNED INTERVENTIONS:  Therapeutic exercises, Therapeutic activity, Neuromuscular re-education, Balance training, Gait training, Patient/Family education, Self Care, Joint mobilization, Stair training, Aquatic Therapy, Dry Needling, Spinal mobilization, Cryotherapy, Moist heat, Manual therapy, and Re-evaluation.  PLAN FOR NEXT SESSION: HEP review and update, manual techniques as appropriate, aerobic tasks, ROM and flexibility activities, strengthening and PREs, TPDN, gait and balance training as needed     Slyvester Latona, PTA 02/14/2023, 9:08 AM

## 2023-02-21 ENCOUNTER — Ambulatory Visit: Payer: BC Managed Care – PPO

## 2023-02-21 DIAGNOSIS — M5431 Sciatica, right side: Secondary | ICD-10-CM | POA: Diagnosis not present

## 2023-02-21 DIAGNOSIS — M6281 Muscle weakness (generalized): Secondary | ICD-10-CM

## 2023-02-21 DIAGNOSIS — M5459 Other low back pain: Secondary | ICD-10-CM

## 2023-02-21 NOTE — Therapy (Signed)
OUTPATIENT PHYSICAL THERAPY TREATMENT NOTE   Patient Name: Perry Hart MRN: [id number] DOB:1960/07/02, 62 y.o., male Today's Date: 02/21/2023  END OF SESSION:  PT End of Session - 02/21/23 0820     Visit Number 5    Number of Visits 12    Date for PT Re-Evaluation 03/29/23    Authorization Type BCBS    PT Start Time 0830    PT Stop Time 0909    PT Time Calculation (min) 39 min    Activity Tolerance Patient tolerated treatment well    Behavior During Therapy WFL for tasks assessed/performed             Past Medical History:  Diagnosis Date   Kidney stones    Renal disorder    Past Surgical History:  Procedure Laterality Date   BACK SURGERY     cervical dissectomy     SPLENECTOMY     There are no problems to display for this patient.   PCP: Patient, No Pcp Per   REFERRING PROVIDER: Currence, Perry Crofts, PA-C  REFERRING DIAG: M54.16 (ICD-10-CM) - Radiculopathy, lumbar region M54.31 (ICD-10-CM) - Sciatica, right side  Rationale for Evaluation and Treatment: Rehabilitation  THERAPY DIAG:  Sciatica, right side  Muscle weakness (generalized)  Other low back pain  ONSET DATE: 12/11/22  SUBJECTIVE:                                                                                                                                                                                           SUBJECTIVE STATEMENT: Patient reports decreased overall pain today, not radiating down his leg.    Seen by [place] 12/11/22. Stated the pain radiates down the back of the leg to the foot. Any OTC's taken do not help. Tried exercises to see if it would help. Has to shift position when seated. Medication issued by ER only took edge off. But the pain has not resolved. More painful walking an incline, painful to get into the car and drive, move to put pressure on petals. Pain with prolonged sitting and walking, stair climbing.  Better with position changes but relief is  temporary.  PERTINENT HISTORY:  History of Present Illness: Perry Hart low back pain. He complains of right buttocks pain that is rating down his right lower leg. All the way into the foot. He does complain of numbness and tingling and burning like pain. No recent injury or trauma. He states is been flared up for about 1 week. He did go to the emergency room for this about a month ago for similar type thing. Current medication was seem to help  some but is actually slowly worsened. He is a very avid runner and is actually run multiple marathons and half marathons previously. Currently he is not able to do that. He states he stopped running and about the the start of COVID. No red flag symptoms.   PAIN:  Are you having pain? Yes: NPRS scale: 310 Pain location: R hip and LE Pain description: ache cramp Aggravating factors: prolonged sitting and walking, arising from chair ascending stairs Relieving factors: position changes, activity  PRECAUTIONS: None  RED FLAGS: None   WEIGHT BEARING RESTRICTIONS: No  FALLS:  Has patient fallen in last 6 months? No  OCCUPATION: maintenance   PLOF: Independent  PATIENT GOALS: To resolve my symptoms  NEXT MD VISIT: as needed  OBJECTIVE:   DIAGNOSTIC FINDINGS:  EXAM: LUMBAR SPINE - COMPLETE 4 VIEW   COMPARISON:  MR L SPine 01/11/08   FINDINGS: Five lumbar-type vertebral bodies. Vertebral body heights are maintained. There is disc space loss in the lower lumbar spine, most notably at L4-L5 and L5-S1. Lower lumbar spine predominant facet degenerative change, greatest at L4-L5 and L5-S1. Symmetric SI joints. Pelvic phleboliths. Nonobstructive bowel gas pattern. Possible small renal stone at the lower pole of the left kidney. No pars.   IMPRESSION: 1.  No acute osseous abnormality.   2. Lower lumbar spine predominant degenerative changes, greatest at L4-L5 and L5-S1.   3.  Possible small renal stone at the lower pole of the left  kidney.     Electronically Signed   By: Perry Hart M.D.   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  POSTURE: No Significant postural limitations  PALPATION: R piriformis unremarkable  LUMBAR ROM:   AROM eval  Flexion 90%  Extension 25%  Right lateral flexion 90%  Left lateral flexion 75%  Right rotation   Left rotation    (Blank rows = not tested)  LOWER EXTREMITY ROM:   WFL B  Active  Right eval Left eval  Hip flexion    Hip extension    Hip abduction    Hip adduction    Hip internal rotation    Hip external rotation    Knee flexion    Knee extension    Ankle dorsiflexion    Ankle plantarflexion    Ankle inversion    Ankle eversion     (Blank rows = not tested)  LOWER EXTREMITY MMT:    MMT Right eval Left eval  Hip flexion    Hip extension    Hip abduction    Hip adduction    Hip internal rotation    Hip external rotation    Knee flexion    Knee extension    Ankle dorsiflexion    Ankle plantarflexion    Ankle inversion    Ankle eversion    Trunk/core 4- 4-   (Blank rows = not tested)  LUMBAR SPECIAL TESTS:  Straight leg raise test: Positive, Slump test: Positive, FABER test: Negative, and R piriformis test negative  FUNCTIONAL TESTS:  5 times sit to stand: 13s  GAIT: Distance walked: 69ftx2 Assistive device utilized: None Level of assistance: Complete Independence Comments: slight antalgic gait with   TODAY'S TREATMENT:   [facility] Adult PT Treatment:  DATE: 02/21/23 Therapeutic Exercise: Nustep L6 8 min Seated hamstring stretch 30sx2 B QL stretch 30s x2 B Supine hip adduction pilates ring 5" hold 2x10 Supine hip fallouts BlueTB 15x B, 15/15 unilateral Bridge against BlueTB 2x10 Bridge with ball squeeze 2x10 SLR with contralateral pilates ring push 2x10 BIL Supine sciatic nerve  glides x20 BIL Prone press x10 Seated pball roll outs fwd/lat x10 ea   [facility] Adult PT Treatment:                                                DATE: 02/14/23 Therapeutic Exercise: Nustep L5 8 min Seated hamstring stretch 30sx2 B QL stretch 30s x2 B Supine hip adduction squeeze 5" hold 2x10 Supine hip fallouts GTB 15x B, 15/15 unilateral Bridge against GTB 2x10 Bridge with ball squeeze 2x10 SLR with contralateral pilates ring push 2x10 BIL Supine sciatic nerve glides x20 BIL Prone press x10 Seated pball roll outs fwd/lat x10 ea    [facility] Adult PT Treatment:                                                DATE: 02/11/23 Therapeutic Exercise: Nustep L4 8 min Seated hamstring stretch 30sx2 B QL stretch 30s x2 B Supine hip fallouts GTB 15x B, 15/15 unilateral Bridge against GTB 15x Bridge with ball squeeze 15x Prone press x10 Seated pball roll outs fwd/lat x10 ea SLR with contralateral pilates ring push 2x10 BIL Supine sciatic nerve glides x20 BIL                                                                                                                           PATIENT EDUCATION:  Education details: Discussed eval findings, rehab rationale and POC and patient is in agreement  Person educated: Patient Education method: Explanation Education comprehension: verbalized understanding and needs further education  HOME EXERCISE PROGRAM: Access Code: [email or web address] URL: [email or web address] Date: 02/02/2023 Prepared by: Gustavus Bryant  Exercises - Seated Table Hamstring Stretch  - 3 x daily - 5 x weekly - 1 sets - 2 reps - 30s hold - Hip Flexor Stretch at Edge of Bed  - 3 x daily - 5 x weekly - 1 sets - 2 reps - 30s hold - Prone Press Up  - 3 x daily - 5 x weekly - 1 sets - 10 reps - 3s hold  ASSESSMENT:  CLINICAL IMPRESSION:  Patient presents to PT reporting continued improvements in his pain, more centralization of symptoms and continued HEP compliance. Session  today continued to focus on proximal hip strengthening and lumbar mobility. Patient was able to tolerate all prescribed exercises with no adverse effects. Patient continues to benefit from skilled PT services and  should be progressed as able to improve functional independence.    EVAL: Patient is a 62 y.o. male who was seen today for physical therapy evaluation and treatment for R hip pain and sciatica. Patient presents with positive R slump and SLR indicating neural tension.  AROM of trunk markedly limited into extension.  Spring testing of lumbar spine finds limited intersegmental mobility.  5x STS time functional.  Tight hip flexors identified as well R>L.  Patient is a good candidate for OPPT to improve mobility and RLE symptoms.  OBJECTIVE IMPAIRMENTS: Abnormal gait, decreased activity tolerance, decreased knowledge of condition, decreased mobility, difficulty walking, increased fascial restrictions, increased muscle spasms, improper body mechanics, and pain.   ACTIVITY LIMITATIONS: carrying, lifting, sitting, standing, squatting, and stairs  PERSONAL FACTORS: Age and Fitness are also affecting patient's functional outcome.   REHAB POTENTIAL: Good  CLINICAL DECISION MAKING: Stable/uncomplicated  EVALUATION COMPLEXITY: Low   GOALS: Goals reviewed with patient? No  SHORT TERM GOALS=LONG TERM GOALS: Target date:   Patient to demonstrate independence in HEP  Baseline: [id number] Goal status: INITIAL  2.  4/5 trunk strength  Baseline: 4-/5 trunk strength Goal status: INITIAL  3.  Increase trunk extension to 50% Baseline:  AROM eval  Flexion 90%  Extension 25%  Right lateral flexion 90%  Left lateral flexion 75%   Goal status: INITIAL  4.  6/10 worst pain Baseline: 10/10 Goal status: INITIAL  5.  Patient will score at least 56% on FOTO to signify clinically meaningful improvement in functional abilities.   Baseline: 32 Goal status: INITIAL     PLAN:  PT FREQUENCY:  1-2x/week  PT DURATION: 6 weeks  PLANNED INTERVENTIONS: Therapeutic exercises, Therapeutic activity, Neuromuscular re-education, Balance training, Gait training, Patient/Family education, Self Care, Joint mobilization, Stair training, Aquatic Therapy, Dry Needling, Spinal mobilization, Cryotherapy, Moist heat, Manual therapy, and Re-evaluation.  PLAN FOR NEXT SESSION: HEP review and update, manual techniques as appropriate, aerobic tasks, ROM and flexibility activities, strengthening and PREs, TPDN, gait and balance training as needed     Jonathan Corpus, PTA 02/21/2023, 9:10 AM

## 2023-02-28 ENCOUNTER — Ambulatory Visit: Payer: BC Managed Care – PPO

## 2023-02-28 DIAGNOSIS — M5459 Other low back pain: Secondary | ICD-10-CM

## 2023-02-28 DIAGNOSIS — M5431 Sciatica, right side: Secondary | ICD-10-CM | POA: Diagnosis not present

## 2023-02-28 DIAGNOSIS — M6281 Muscle weakness (generalized): Secondary | ICD-10-CM

## 2023-02-28 NOTE — Therapy (Signed)
OUTPATIENT PHYSICAL THERAPY TREATMENT NOTE   Patient Name: Perry Hart MRN: [id number] DOB:July 01, 1960, 62 y.o., male Today's Date: 02/28/2023  END OF SESSION:  PT End of Session - 02/28/23 0817     Visit Number 6    Number of Visits 12    Date for PT Re-Evaluation 03/29/23    Authorization Type [employer]    PT Start Time 0825    PT Stop Time 0905    PT Time Calculation (min) 40 min    Activity Tolerance Patient tolerated treatment well    Behavior During Therapy WFL for tasks assessed/performed              Past Medical History:  Diagnosis Date   Kidney stones    Renal disorder    Past Surgical History:  Procedure Laterality Date   BACK SURGERY     cervical dissectomy     SPLENECTOMY     There are no problems to display for this patient.   PCP: Patient, No Pcp Per   REFERRING PROVIDER: Currence, Perry Crofts, PA-C  REFERRING DIAG: M54.16 (ICD-10-CM) - Radiculopathy, lumbar region M54.31 (ICD-10-CM) - Sciatica, right side  Rationale for Evaluation and Treatment: Rehabilitation  THERAPY DIAG:  Sciatica, right side  Muscle weakness (generalized)  Other low back pain  ONSET DATE: 12/11/22  SUBJECTIVE:                                                                                                                                                                                           SUBJECTIVE STATEMENT: Patient reports he did a lot of walking yesterday and has some soreness, but his pain is low, no symptoms radiating today.   Seen by [place] 12/11/22. Stated the pain radiates down the back of the leg to the foot. Any OTC's taken do not help. Tried exercises to see if it would help. Has to shift position when seated. Medication issued by ER only took edge off. But the pain has not resolved. More painful walking an incline, painful to get into the car and drive, move to put pressure on petals. Pain with prolonged sitting and walking, stair climbing.  Better with  position changes but relief is temporary.  PERTINENT HISTORY:  History of Present Illness: Perry Hart low back pain. He complains of right buttocks pain that is rating down his right lower leg. All the way into the foot. He does complain of numbness and tingling and burning like pain. No recent injury or trauma. He states is been flared up for about 1 week. He did go to the emergency room for this about a month  ago for similar type thing. Current medication was seem to help some but is actually slowly worsened. He is a very avid runner and is actually run multiple marathons and half marathons previously. Currently he is not able to do that. He states he stopped running and about the the start of COVID. No red flag symptoms.   PAIN:  Are you having pain? Yes: NPRS scale: 3/10 Pain location: R hip and LE Pain description: ache cramp Aggravating factors: prolonged sitting and walking, arising from chair ascending stairs Relieving factors: position changes, activity  PRECAUTIONS: None  RED FLAGS: None   WEIGHT BEARING RESTRICTIONS: No  FALLS:  Has patient fallen in last 6 months? No  OCCUPATION: maintenance   PLOF: Independent  PATIENT GOALS: To resolve my symptoms  NEXT MD VISIT: as needed  OBJECTIVE:   DIAGNOSTIC FINDINGS:  EXAM: LUMBAR SPINE - COMPLETE 4 VIEW   COMPARISON:  MR L SPine 01/11/08   FINDINGS: Five lumbar-type vertebral bodies. Vertebral body heights are maintained. There is disc space loss in the lower lumbar spine, most notably at L4-L5 and L5-S1. Lower lumbar spine predominant facet degenerative change, greatest at L4-L5 and L5-S1. Symmetric SI joints. Pelvic phleboliths. Nonobstructive bowel gas pattern. Possible small renal stone at the lower pole of the left kidney. No pars.   IMPRESSION: 1.  No acute osseous abnormality.   2. Lower lumbar spine predominant degenerative changes, greatest at L4-L5 and L5-S1.   3.  Possible small renal stone at  the lower pole of the left kidney.     Electronically Signed   By: Perry Hart M.D.   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  POSTURE: No Significant postural limitations  PALPATION: R piriformis unremarkable  LUMBAR ROM:   AROM eval  Flexion 90%  Extension 25%  Right lateral flexion 90%  Left lateral flexion 75%  Right rotation   Left rotation    (Blank rows = not tested)  LOWER EXTREMITY ROM:   WFL B  Active  Right eval Left eval  Hip flexion    Hip extension    Hip abduction    Hip adduction    Hip internal rotation    Hip external rotation    Knee flexion    Knee extension    Ankle dorsiflexion    Ankle plantarflexion    Ankle inversion    Ankle eversion     (Blank rows = not tested)  LOWER EXTREMITY MMT:    MMT Right eval Left eval  Hip flexion    Hip extension    Hip abduction    Hip adduction    Hip internal rotation    Hip external rotation    Knee flexion    Knee extension    Ankle dorsiflexion    Ankle plantarflexion    Ankle inversion    Ankle eversion    Trunk/core 4- 4-   (Blank rows = not tested)  LUMBAR SPECIAL TESTS:  Straight leg raise test: Positive, Slump test: Positive, FABER test: Negative, and R piriformis test negative  FUNCTIONAL TESTS:  5 times sit to stand: 13s  GAIT: Distance walked: 31ftx2 Assistive device utilized: None Level of assistance: Complete Independence Comments: slight antalgic gait with   TODAY'S TREATMENT:   [facility] Adult PT Treatment:  DATE: 02/28/23 Therapeutic Exercise: Nustep L6 8 min Seated hamstring stretch 30sx2 B QL stretch 30s x2 B Supine hip adduction pilates ring 5" hold 2x10 Supine hip fallouts BlueTB 15x B, 15/15 unilateral Bridge against BlueTB 2x10 Bridge with ball squeeze 2x10 SLR with contralateral pilates ring push 2x10  BIL Supine sciatic nerve glides x20 BIL Prone press x10 Modified thomas stretch x1' BIL Seated pball roll outs fwd/lat x10 ea   [facility] Adult PT Treatment:                                                DATE: 02/21/23 Therapeutic Exercise: Nustep L6 8 min Seated hamstring stretch 30sx2 B QL stretch 30s x2 B Supine hip adduction pilates ring 5" hold 2x10 Supine hip fallouts BlueTB 15x B, 15/15 unilateral Bridge against BlueTB 2x10 Bridge with ball squeeze 2x10 SLR with contralateral pilates ring push 2x10 BIL Supine sciatic nerve glides x20 BIL Prone press x10 Seated pball roll outs fwd/lat x10 ea                                                                                                                          PATIENT EDUCATION:  Education details: Discussed eval findings, rehab rationale and POC and patient is in agreement  Person educated: Patient Education method: Explanation Education comprehension: verbalized understanding and needs further education  HOME EXERCISE PROGRAM: Access Code: [email or web address] URL: [email or web address] Date: 02/02/2023 Prepared by: Gustavus Bryant  Exercises - Seated Table Hamstring Stretch  - 3 x daily - 5 x weekly - 1 sets - 2 reps - 30s hold - Hip Flexor Stretch at Edge of Bed  - 3 x daily - 5 x weekly - 1 sets - 2 reps - 30s hold - Prone Press Up  - 3 x daily - 5 x weekly - 1 sets - 10 reps - 3s hold  ASSESSMENT:  CLINICAL IMPRESSION:  Patient presents to PT reporting minimal pain in his lower back and no symptoms radiating today. Session today focused on proximal hip strengthening and stretching as well as lumbar mobility. Patient was able to tolerate all prescribed exercises with no adverse effects. Patient continues to benefit from skilled PT services and should be progressed as able to improve functional independence.    EVAL: Patient is a 62 y.o. male who was seen today for physical therapy evaluation and treatment for R  hip pain and sciatica. Patient presents with positive R slump and SLR indicating neural tension.  AROM of trunk markedly limited into extension.  Spring testing of lumbar spine finds limited intersegmental mobility.  5x STS time functional.  Tight hip flexors identified as well R>L.  Patient is a good candidate for OPPT to improve mobility and RLE symptoms.  OBJECTIVE IMPAIRMENTS: Abnormal gait, decreased activity tolerance, decreased knowledge of condition, decreased mobility, difficulty walking, increased  fascial restrictions, increased muscle spasms, improper body mechanics, and pain.   ACTIVITY LIMITATIONS: carrying, lifting, sitting, standing, squatting, and stairs  PERSONAL FACTORS: Age and Fitness are also affecting patient's functional outcome.   REHAB POTENTIAL: Good  CLINICAL DECISION MAKING: Stable/uncomplicated  EVALUATION COMPLEXITY: Low   GOALS: Goals reviewed with patient? No  SHORT TERM GOALS=LONG TERM GOALS: Target date:   Patient to demonstrate independence in HEP  Baseline: [id number] Goal status: MET  2.  4/5 trunk strength  Baseline: 4-/5 trunk strength Goal status: INITIAL  3.  Increase trunk extension to 50% Baseline:  AROM eval  Flexion 90%  Extension 25%  Right lateral flexion 90%  Left lateral flexion 75%   Goal status: INITIAL  4.  6/10 worst pain Baseline: 10/10 Goal status: INITIAL  5.  Patient will score at least 56% on FOTO to signify clinically meaningful improvement in functional abilities.   Baseline: 32 Goal status: INITIAL     PLAN:  PT FREQUENCY: 1-2x/week  PT DURATION: 6 weeks  PLANNED INTERVENTIONS: Therapeutic exercises, Therapeutic activity, Neuromuscular re-education, Balance training, Gait training, Patient/Family education, Self Care, Joint mobilization, Stair training, Aquatic Therapy, Dry Needling, Spinal mobilization, Cryotherapy, Moist heat, Manual therapy, and Re-evaluation.  PLAN FOR NEXT SESSION: HEP review and  update, manual techniques as appropriate, aerobic tasks, ROM and flexibility activities, strengthening and PREs, TPDN, gait and balance training as needed     Mordecai Oo, PTA 02/28/2023, 9:10 AM

## 2023-03-08 NOTE — Therapy (Signed)
OUTPATIENT PHYSICAL THERAPY TREATMENT NOTE   Patient Name: Perry Hart MRN: [id number] DOB:05/21/1960, 62 y.o., male Today's Date: 03/10/2023  END OF SESSION:  PT End of Session - 03/10/23 0830     Visit Number 7    Number of Visits 12    Date for PT Re-Evaluation 03/29/23    Authorization Type [employer]    PT Start Time 0830    PT Stop Time 0910    PT Time Calculation (min) 40 min    Activity Tolerance Patient tolerated treatment well    Behavior During Therapy WFL for tasks assessed/performed               Past Medical History:  Diagnosis Date   Kidney stones    Renal disorder    Past Surgical History:  Procedure Laterality Date   BACK SURGERY     cervical dissectomy     SPLENECTOMY     There are no problems to display for this patient.   PCP: Patient, No Pcp Per   REFERRING PROVIDER: Currence, Vladimir Crofts, PA-C  REFERRING DIAG: M54.16 (ICD-10-CM) - Radiculopathy, lumbar region M54.31 (ICD-10-CM) - Sciatica, right side  Rationale for Evaluation and Treatment: Rehabilitation  THERAPY DIAG:  Sciatica, right side  Muscle weakness (generalized)  Other low back pain  ONSET DATE: 12/11/22  SUBJECTIVE:                                                                                                                                                                                           SUBJECTIVE STATEMENT: Patient reports he did a lot of walking yesterday and has some soreness, but his pain is low, no symptoms radiating today.   Seen by [facility] 12/11/22. Stated the pain radiates down the back of the leg to the foot. Any OTC's taken do not help. Tried exercises to see if it would help. Has to shift position when seated. Medication issued by ER only took edge off. But the pain has not resolved. More painful walking an incline, painful to get into the car and drive, move to put pressure on petals. Pain with prolonged sitting and walking, stair climbing.  Better with  position changes but relief is temporary.  PERTINENT HISTORY:  History of Present Illness: Perry Hart low back pain. He complains of right buttocks pain that is rating down his right lower leg. All the way into the foot. He does complain of numbness and tingling and burning like pain. No recent injury or trauma. He states is been flared up for about 1 week. He did go to the emergency room for this about a  month ago for similar type thing. Current medication was seem to help some but is actually slowly worsened. He is a very avid runner and is actually run multiple marathons and half marathons previously. Currently he is not able to do that. He states he stopped running and about the the start of COVID. No red flag symptoms.   PAIN:  Are you having pain? Yes: NPRS scale: 3/10 Pain location: R hip and LE Pain description: ache cramp Aggravating factors: prolonged sitting and walking, arising from chair ascending stairs Relieving factors: position changes, activity  PRECAUTIONS: None  RED FLAGS: None   WEIGHT BEARING RESTRICTIONS: No  FALLS:  Has patient fallen in last 6 months? No  OCCUPATION: maintenance   PLOF: Independent  PATIENT GOALS: To resolve my symptoms  NEXT MD VISIT: as needed  OBJECTIVE:   DIAGNOSTIC FINDINGS:  EXAM: LUMBAR SPINE - COMPLETE 4 VIEW   COMPARISON:  MR L SPine 01/11/08   FINDINGS: Five lumbar-type vertebral bodies. Vertebral body heights are maintained. There is disc space loss in the lower lumbar spine, most notably at L4-L5 and L5-S1. Lower lumbar spine predominant facet degenerative change, greatest at L4-L5 and L5-S1. Symmetric SI joints. Pelvic phleboliths. Nonobstructive bowel gas pattern. Possible small renal stone at the lower pole of the left kidney. No pars.   IMPRESSION: 1.  No acute osseous abnormality.   2. Lower lumbar spine predominant degenerative changes, greatest at L4-L5 and L5-S1.   3.  Possible small renal stone at  the lower pole of the left kidney.     Electronically Signed   By: Lorenza Cambridge M.D.   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  POSTURE: No Significant postural limitations  PALPATION: R piriformis unremarkable  LUMBAR ROM:   AROM eval  Flexion 90%  Extension 25%  Right lateral flexion 90%  Left lateral flexion 75%  Right rotation   Left rotation    (Blank rows = not tested)  LOWER EXTREMITY ROM:   WFL B  Active  Right eval Left eval  Hip flexion    Hip extension    Hip abduction    Hip adduction    Hip internal rotation    Hip external rotation    Knee flexion    Knee extension    Ankle dorsiflexion    Ankle plantarflexion    Ankle inversion    Ankle eversion     (Blank rows = not tested)  LOWER EXTREMITY MMT:    MMT Right eval Left eval  Hip flexion    Hip extension    Hip abduction    Hip adduction    Hip internal rotation    Hip external rotation    Knee flexion    Knee extension    Ankle dorsiflexion    Ankle plantarflexion    Ankle inversion    Ankle eversion    Trunk/core 4- 4-   (Blank rows = not tested)  LUMBAR SPECIAL TESTS:  Straight leg raise test: Positive, Slump test: Positive, FABER test: Negative, and R piriformis test negative  FUNCTIONAL TESTS:  5 times sit to stand: 13s  GAIT: Distance walked: 93ftx2 Assistive device utilized: None Level of assistance: Complete Independence Comments: slight antalgic gait with   TODAY'S TREATMENT:   [facility] Adult PT Treatment:  DATE: 03/10/23 Therapeutic Exercise: Nustep L6 8 min STS 10# KB Bridge against BlaTB 15x Supine hip fallouts BlaTB 15x B, 15/15 unilateral S/L clams BlaTB 15/15 Bridge with ball squeeze 15 200g ball Single leg bridge over 1/2 roll to challenge proprioception Lunge to bolster 10/10 P-ball curl ups 15x B,  15/15 unilateral  [facility] Adult PT Treatment:                                                DATE: 02/28/23 Therapeutic Exercise: Nustep L6 8 min Seated hamstring stretch 30sx2 B QL stretch 30s x2 B Supine hip adduction pilates ring 5" hold 2x10 Supine hip fallouts BlueTB 15x B, 15/15 unilateral Bridge against BlueTB 2x10 Bridge with ball squeeze 2x10 SLR with contralateral pilates ring push 2x10 BIL Supine sciatic nerve glides x20 BIL Prone press x10 Modified thomas stretch x1' BIL Seated pball roll outs fwd/lat x10 ea   [facility] Adult PT Treatment:                                                DATE: 02/21/23 Therapeutic Exercise: Nustep L6 8 min Seated hamstring stretch 30sx2 B QL stretch 30s x2 B Supine hip adduction pilates ring 5" hold 2x10 Supine hip fallouts BlueTB 15x B, 15/15 unilateral Bridge against BlueTB 2x10 Bridge with ball squeeze 2x10 SLR with contralateral pilates ring push 2x10 BIL Supine sciatic nerve glides x20 BIL Prone press x10 Seated pball roll outs fwd/lat x10 ea                                                                                                                          PATIENT EDUCATION:  Education details: Discussed eval findings, rehab rationale and POC and patient is in agreement  Person educated: Patient Education method: Explanation Education comprehension: verbalized understanding and needs further education  HOME EXERCISE PROGRAM: Access Code: [email or web address] URL: [email or web address] Date: 02/02/2023 Prepared by: Gustavus Bryant  Exercises - Seated Table Hamstring Stretch  - 3 x daily - 5 x weekly - 1 sets - 2 reps - 30s hold - Hip Flexor Stretch at Edge of Bed  - 3 x daily - 5 x weekly - 1 sets - 2 reps - 30s hold - Prone Press Up  - 3 x daily - 5 x weekly - 1 sets - 10 reps - 3s hold  ASSESSMENT:  CLINICAL IMPRESSION: R low back pain minimal to resolved.  FOTO score 95%.  Advanced difficulty of LE strengthening by  incorporating more difficult tasks including STS and lunging.  R hip weakness noted when performing contralateral tasks.  Goals addressed and updated.  Introduced core tasks for stability.   EVAL: Patient is a 62 y.o.  male who was seen today for physical therapy evaluation and treatment for R hip pain and sciatica. Patient presents with positive R slump and SLR indicating neural tension.  AROM of trunk markedly limited into extension.  Spring testing of lumbar spine finds limited intersegmental mobility.  5x STS time functional.  Tight hip flexors identified as well R>L.  Patient is a good candidate for OPPT to improve mobility and RLE symptoms.  OBJECTIVE IMPAIRMENTS: Abnormal gait, decreased activity tolerance, decreased knowledge of condition, decreased mobility, difficulty walking, increased fascial restrictions, increased muscle spasms, improper body mechanics, and pain.   ACTIVITY LIMITATIONS: carrying, lifting, sitting, standing, squatting, and stairs  PERSONAL FACTORS: Age and Fitness are also affecting patient's functional outcome.   REHAB POTENTIAL: Good  CLINICAL DECISION MAKING: Stable/uncomplicated  EVALUATION COMPLEXITY: Low   GOALS: Goals reviewed with patient? No  SHORT TERM GOALS=LONG TERM GOALS: Target date:   Patient to demonstrate independence in HEP  Baseline: [id number] Goal status: MET  2.  4/5 trunk strength  Baseline: 4-/5 trunk strength Goal status: INITIAL  3.  Increase trunk extension to 50% Baseline:  AROM eval  Flexion 90%  Extension 25%  Right lateral flexion 90%  Left lateral flexion 75%   Goal status: INITIAL  4.  6/10 worst pain Baseline: 10/10; 03/10/23 6/10 1 week ago Goal status: Met  5.  Patient will score at least 56% on FOTO to signify clinically meaningful improvement in functional abilities.   Baseline: 32; 03/10/23 95% Goal status: Met     PLAN:  PT FREQUENCY: 1-2x/week  PT DURATION: 6 weeks  PLANNED INTERVENTIONS:  Therapeutic exercises, Therapeutic activity, Neuromuscular re-education, Balance training, Gait training, Patient/Family education, Self Care, Joint mobilization, Stair training, Aquatic Therapy, Dry Needling, Spinal mobilization, Cryotherapy, Moist heat, Manual therapy, and Re-evaluation.  PLAN FOR NEXT SESSION: HEP review and update, manual techniques as appropriate, aerobic tasks, ROM and flexibility activities, strengthening and PREs, TPDN, gait and balance training as needed     Hildred Laser, PT 03/10/2023, 9:14 AM

## 2023-03-10 ENCOUNTER — Ambulatory Visit: Payer: BC Managed Care – PPO | Attending: Physician Assistant

## 2023-03-10 DIAGNOSIS — M5431 Sciatica, right side: Secondary | ICD-10-CM | POA: Diagnosis present

## 2023-03-10 DIAGNOSIS — M6281 Muscle weakness (generalized): Secondary | ICD-10-CM | POA: Diagnosis present

## 2023-03-10 DIAGNOSIS — M5459 Other low back pain: Secondary | ICD-10-CM | POA: Diagnosis present

## 2023-03-22 NOTE — Therapy (Signed)
OUTPATIENT PHYSICAL THERAPY TREATMENT NOTE/DC SUMMARY   Patient Name: Perry Hart MRN: [id number] DOB:[birth year], 62 y.o., male Today's Date: 03/24/2023 PHYSICAL THERAPY DISCHARGE SUMMARY  Visits from Start of Care: 8  Current functional level related to goals / functional outcomes: Goals met   Remaining deficits: ROM   Education / Equipment: HEP   Patient agrees to discharge. Patient goals were met. Patient is being discharged due to being pleased with the current functional level.  END OF SESSION:  PT End of Session - 03/24/23 1004     Visit Number 8    Number of Visits 12    Date for PT Re-Evaluation 03/29/23    PT Start Time 1000    PT Stop Time 1040    PT Time Calculation (min) 40 min    Activity Tolerance Patient tolerated treatment well    Behavior During Therapy WFL for tasks assessed/performed                Past Medical History:  Diagnosis Date   Kidney stones    Renal disorder    Past Surgical History:  Procedure Laterality Date   BACK SURGERY     cervical dissectomy     SPLENECTOMY     There are no problems to display for this patient.   PCP: Patient, No Pcp Per   REFERRING PROVIDER: Currence, Vladimir Crofts, PA-C  REFERRING DIAG: M54.16 (ICD-10-CM) - Radiculopathy, lumbar region M54.31 (ICD-10-CM) - Sciatica, right side  Rationale for Evaluation and Treatment: Rehabilitation  THERAPY DIAG:  Sciatica, right side  Muscle weakness (generalized)  Other low back pain  ONSET DATE: 12/11/22  SUBJECTIVE:                                                                                                                                                                                           SUBJECTIVE STATEMENT: Still rates R hip symptoms at 6/10   Seen by [place] 12/11/22. Stated the pain radiates down the back of the leg to the foot. Any OTC's taken do not help. Tried exercises to see if it would help. Has to shift position when seated.  Medication issued by ER only took edge off. But the pain has not resolved. More painful walking an incline, painful to get into the car and drive, move to put pressure on petals. Pain with prolonged sitting and walking, stair climbing.  Better with position changes but relief is temporary.  PERTINENT HISTORY:  History of Present Illness: [place] low back pain. He complains of right buttocks pain that is rating down his right lower leg. All the way into the  foot. He does complain of numbness and tingling and burning like pain. No recent injury or trauma. He states is been flared up for about 1 week. He did go to the emergency room for this about a month ago for similar type thing. Current medication was seem to help some but is actually slowly worsened. He is a very avid runner and is actually run multiple marathons and half marathons previously. Currently he is not able to do that. He states he stopped running and about the the start of COVID. No red flag symptoms.   PAIN:  Are you having pain? Yes: NPRS scale: 3/10 Pain location: R hip and LE Pain description: ache cramp Aggravating factors: prolonged sitting and walking, arising from chair ascending stairs Relieving factors: position changes, activity  PRECAUTIONS: None  RED FLAGS: None   WEIGHT BEARING RESTRICTIONS: No  FALLS:  Has patient fallen in last 6 months? No  OCCUPATION: maintenance   PLOF: Independent  PATIENT GOALS: To resolve my symptoms  NEXT MD VISIT: as needed  OBJECTIVE:   DIAGNOSTIC FINDINGS:  EXAM: LUMBAR SPINE - COMPLETE 4 VIEW   COMPARISON:  MR L SPine 01/11/08   FINDINGS: Five lumbar-type vertebral bodies. Vertebral body heights are maintained. There is disc space loss in the lower lumbar spine, most notably at L4-L5 and L5-S1. Lower lumbar spine predominant facet degenerative change, greatest at L4-L5 and L5-S1. Symmetric SI joints. Pelvic phleboliths. Nonobstructive bowel gas  pattern. Possible small renal stone at the lower pole of the left kidney. No pars.   IMPRESSION: 1.  No acute osseous abnormality.   2. Lower lumbar spine predominant degenerative changes, greatest at L4-L5 and L5-S1.   3.  Possible small renal stone at the lower pole of the left kidney.     Electronically Signed   By: Lorenza Cambridge M.D.   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  POSTURE: No Significant postural limitations  PALPATION: R piriformis unremarkable  LUMBAR ROM:   AROM eval 03/24/23  Flexion 90% 90%  Extension 25% 75%  Right lateral flexion 90% 90%  Left lateral flexion 75% 90%  Right rotation    Left rotation     (Blank rows = not tested)  LOWER EXTREMITY ROM:   WFL B  Active  Right eval Left eval  Hip flexion    Hip extension    Hip abduction    Hip adduction    Hip internal rotation    Hip external rotation    Knee flexion    Knee extension    Ankle dorsiflexion    Ankle plantarflexion    Ankle inversion    Ankle eversion     (Blank rows = not tested)  LOWER EXTREMITY MMT:    MMT Right eval Left eval B 03/24/23  Hip flexion     Hip extension     Hip abduction     Hip adduction     Hip internal rotation     Hip external rotation     Knee flexion     Knee extension     Ankle dorsiflexion     Ankle plantarflexion     Ankle inversion     Ankle eversion     Trunk/core 4- 4- 4   (Blank rows = not tested)  LUMBAR SPECIAL TESTS:  Straight leg raise test: Positive, Slump test: Positive, FABER test: Negative, and  R piriformis test negative  FUNCTIONAL TESTS:  5 times sit to stand: 13s  GAIT: Distance walked: 54ftx2 Assistive device utilized: None Level of assistance: Complete Independence Comments: slight antalgic gait with   TODAY'S TREATMENT:   [facility] Adult PT Treatment:                                                 DATE: 03/10/23 Therapeutic Exercise: Nustep L6 8 min STS 10# KB Bridge against BlaTB 15x Supine hip fallouts BlaTB 15x B, 15/15 unilateral S/L clams BlaTB 15/15 Bridge with ball squeeze 15 200g ball Single leg bridge over 1/2 roll to challenge proprioception Lunge to bolster 10/10 P-ball curl ups 15x B, 15/15 unilateral  [facility] Adult PT Treatment:                                                DATE: 02/28/23 Therapeutic Exercise: Nustep L6 8 min Seated hamstring stretch 30sx2 B QL stretch 30s x2 B Supine hip adduction pilates ring 5" hold 2x10 Supine hip fallouts BlueTB 15x B, 15/15 unilateral Bridge against BlueTB 2x10 Bridge with ball squeeze 2x10 SLR with contralateral pilates ring push 2x10 BIL Supine sciatic nerve glides x20 BIL Prone press x10 Modified thomas stretch x1' BIL Seated pball roll outs fwd/lat x10 ea   [facility] Adult PT Treatment:                                                DATE: 02/21/23 Therapeutic Exercise: Nustep L6 8 min Seated hamstring stretch 30sx2 B QL stretch 30s x2 B Supine hip adduction pilates ring 5" hold 2x10 Supine hip fallouts BlueTB 15x B, 15/15 unilateral Bridge against BlueTB 2x10 Bridge with ball squeeze 2x10 SLR with contralateral pilates ring push 2x10 BIL Supine sciatic nerve glides x20 BIL Prone press x10 Seated pball roll outs fwd/lat x10 ea                                                                                                                          PATIENT EDUCATION:  Education details: Discussed eval findings, rehab rationale and POC and patient is in agreement  Person educated: Patient Education method: Explanation Education comprehension: verbalized understanding and needs further education  HOME EXERCISE PROGRAM: Access Code: [email or web address] URL: [email or web address] Date: 03/24/2023 Prepared by: Gustavus Bryant  Exercises - Prone Press Up  - 2 x daily - 5 x weekly - 1 sets - 10 reps - 3s hold -  Right Standing Lateral Shift Correction at Wall - Repetitions  - 2 x daily - 5 x  weekly - 1 sets - 10 reps - Curl Up with Arms Crossed  - 2 x daily - 5 x weekly - 1 sets - 15 reps - Supine 90/90 Abdominal Bracing  - 2 x daily - 5 x weekly - 1 sets - 2 reps  ASSESSMENT:  CLINICAL IMPRESSION: Rehab goals met, HEP updated to reflect progress and remaining deficits   EVAL: Patient is a 62 y.o. male who was seen today for physical therapy evaluation and treatment for R hip pain and sciatica. Patient presents with positive R slump and SLR indicating neural tension.  AROM of trunk markedly limited into extension.  Spring testing of lumbar spine finds limited intersegmental mobility.  5x STS time functional.  Tight hip flexors identified as well R>L.  Patient is a good candidate for OPPT to improve mobility and RLE symptoms.  OBJECTIVE IMPAIRMENTS: Abnormal gait, decreased activity tolerance, decreased knowledge of condition, decreased mobility, difficulty walking, increased fascial restrictions, increased muscle spasms, improper body mechanics, and pain.   ACTIVITY LIMITATIONS: carrying, lifting, sitting, standing, squatting, and stairs  PERSONAL FACTORS: Age and Fitness are also affecting patient's functional outcome.   REHAB POTENTIAL: Good  CLINICAL DECISION MAKING: Stable/uncomplicated  EVALUATION COMPLEXITY: Low   GOALS: Goals reviewed with patient? No  SHORT TERM GOALS=LONG TERM GOALS: Target date:   Patient to demonstrate independence in HEP  Baseline: [id number] Goal status: MET  2.  4/5 trunk strength  Baseline: 4-/5 trunk strength; 03/24/23  Trunk/core 4- 4- 4   Goal status: Met  3.  Increase trunk extension to 50% Baseline:  AROM eval 03/24/23  Flexion 90% 90%  Extension 25% 75%  Right lateral flexion 90% 90%  Left lateral flexion 75% 90%   Goal status: Met  4.  6/10 worst pain Baseline: 10/10; 03/10/23 6/10 1 week ago Goal status: Met  5.  Patient will score at  least 56% on FOTO to signify clinically meaningful improvement in functional abilities.   Baseline: 32; 03/10/23 95% Goal status: Met     PLAN:  PT FREQUENCY: 1-2x/week  PT DURATION: 6 weeks  PLANNED INTERVENTIONS: Therapeutic exercises, Therapeutic activity, Neuromuscular re-education, Balance training, Gait training, Patient/Family education, Self Care, Joint mobilization, Stair training, Aquatic Therapy, Dry Needling, Spinal mobilization, Cryotherapy, Moist heat, Manual therapy, and Re-evaluation.  PLAN FOR NEXT SESSION: HEP review and update, manual techniques as appropriate, aerobic tasks, ROM and flexibility activities, strengthening and PREs, TPDN, gait and balance training as needed     Hildred Laser, PT 03/24/2023, 10:37 AM

## 2023-03-24 ENCOUNTER — Ambulatory Visit: Payer: BC Managed Care – PPO

## 2023-03-24 DIAGNOSIS — M6281 Muscle weakness (generalized): Secondary | ICD-10-CM

## 2023-03-24 DIAGNOSIS — M5431 Sciatica, right side: Secondary | ICD-10-CM

## 2023-03-24 DIAGNOSIS — M5459 Other low back pain: Secondary | ICD-10-CM

## 2024-02-20 ENCOUNTER — Ambulatory Visit (HOSPITAL_COMMUNITY)
Admission: EM | Admit: 2024-02-20 | Discharge: 2024-02-20 | Disposition: A | Discharge status: Home / Self Care | Attending: Family Medicine | Admitting: Family Medicine

## 2024-02-20 ENCOUNTER — Encounter (HOSPITAL_COMMUNITY): Payer: Self-pay

## 2024-02-20 DIAGNOSIS — J069 Acute upper respiratory infection, unspecified: Secondary | ICD-10-CM

## 2024-02-20 DIAGNOSIS — M545 Low back pain, unspecified: Secondary | ICD-10-CM | POA: Diagnosis not present

## 2024-02-20 DIAGNOSIS — R051 Acute cough: Secondary | ICD-10-CM

## 2024-02-20 LAB — POC COVID19/FLU A&B COMBO
Covid Antigen, POC: NEGATIVE
Influenza A Antigen, POC: NEGATIVE
Influenza B Antigen, POC: NEGATIVE

## 2024-02-20 MED ORDER — BENZONATATE 200 MG PO CAPS: 200.0000 mg | ORAL_CAPSULE | Freq: Three times a day (TID) | ORAL | 0 refills | Status: AC | PRN

## 2024-02-20 MED ORDER — PREDNISONE 10 MG (21) PO TBPK: ORAL_TABLET | ORAL | 0 refills | Status: AC

## 2024-02-20 MED ORDER — METHOCARBAMOL 500 MG PO TABS: 500.0000 mg | ORAL_TABLET | Freq: Two times a day (BID) | ORAL | 0 refills | Status: AC

## 2024-02-20 NOTE — ED Triage Notes (Addendum)
 Patient reports that he hs had a non productive cough, fever, chills, nasal congestion x 3-4.   Patient also c/o bilateral lower back pain x 2 days. Patient states he has a disc issue. Patient states the pain has radiated down his legs since pain started. Patient added that he also has a headache.  Patient has been taking Alka-seltzer Cold and Tylenol for his symptoms.

## 2024-02-20 NOTE — ED Provider Notes (Signed)
 [facility]    CSN: [id number] Arrival date & time: 02/20/24  1107      History   Chief Complaint Chief Complaint  Patient presents with   Cough   Fever   Back Pain   Nasal Congestion    HPI Perry Hart is a 63 y.o. male.    Cough Associated symptoms: fever   Associated symptoms: no shortness of breath and no wheezing   Fever Associated symptoms: congestion and cough   Back Pain Associated symptoms: fever    Patient is here for cough, chills, congestion x 3-4 days.  He has not checked his temp at home. No known sick contacts.  He has taken otc medications with some help.   He is also having some low back pain x several days, achiness. He also has pain down into his legs.  No numbness/tingling.  No otc meds taken.        Past Medical History:  Diagnosis Date   Kidney stones    Renal disorder     There are no active problems to display for this patient.   Past Surgical History:  Procedure Laterality Date   BACK SURGERY     cervical dissectomy     SPLENECTOMY         Home Medications    Prior to Admission medications   Medication Sig Start Date End Date Taking? Authorizing Provider  methocarbamol  (ROBAXIN ) 500 MG tablet Take 1 tablet (500 mg total) by mouth 2 (two) times daily. Patient not taking: Reported on 02/20/2024 12/11/22   Dakermandji, Caitlin, DO  naproxen  (NAPROSYN ) 500 MG tablet Take 1 tablet (500 mg total) by mouth 2 (two) times daily. Patient not taking: Reported on 02/20/2024 12/11/22   Dakermandji, Caitlin, DO  predniSONE  (STERAPRED UNI-PAK 21 TAB) 10 MG (21) TBPK tablet 6 tabs for 1 day, then 5 tabs for 1 das, then 4 tabs for 1 day, then 3 tabs for 1 day, 2 tabs for 1 day, then 1 tab for 1 day Patient not taking: Reported on 02/20/2024 11/08/22   Teresa Shelba SAUNDERS, NP  tiZANidine  (ZANAFLEX ) 4 MG tablet Take 1 tablet (4 mg total) by mouth every 6 (six) hours as needed for muscle spasms. Patient not taking: Reported on  02/20/2024 11/08/22   Teresa Shelba SAUNDERS, NP    Family History Family History  Problem Relation Age of Onset   Diabetes Mother    Hypertension Father     Social History Social History   Tobacco Use   Smoking status: Never   Smokeless tobacco: Never  Vaping Use   Vaping status: Never Used  Substance Use Topics   Alcohol use: Yes   Drug use: Never     Allergies   Patient has no known allergies.   Review of Systems Review of Systems  Constitutional:  Positive for fever.  HENT:  Positive for congestion.   Respiratory:  Positive for cough. Negative for shortness of breath and wheezing.   Cardiovascular: Negative.   Gastrointestinal: Negative.   Genitourinary: Negative.   Musculoskeletal:  Positive for back pain.  Psychiatric/Behavioral: Negative.       Physical Exam Triage Vital Signs ED Triage Vitals [02/20/24 1214]  Encounter Vitals Group     BP 111/67     Girls Systolic BP Percentile      Girls Diastolic BP Percentile      Boys Systolic BP Percentile      Boys Diastolic BP Percentile  Pulse Rate 89     Resp 16     Temp 98.2 F (36.8 C)     Temp Source Oral     SpO2 96 %     Weight      Height      Head Circumference      Peak Flow      Pain Score 8     Pain Loc      Pain Education      Exclude from Growth Chart    No data found.  Updated Vital Signs BP 111/67 (BP Location: Left Arm)   Pulse 89   Temp 98.2 F (36.8 C) (Oral)   Resp 16   SpO2 96%   Visual Acuity Right Eye Distance:   Left Eye Distance:   Bilateral Distance:    Right Eye Near:   Left Eye Near:    Bilateral Near:     Physical Exam Constitutional:      General: He is not in acute distress.    Appearance: Normal appearance. He is normal weight. He is not ill-appearing or toxic-appearing.  HENT:     Nose: Nose normal.     Mouth/Throat:     Mouth: Mucous membranes are moist.  Cardiovascular:     Rate and Rhythm: Normal rate and regular rhythm.  Pulmonary:      Effort: Pulmonary effort is normal.     Breath sounds: Normal breath sounds. No wheezing, rhonchi or rales.  Musculoskeletal:        General: No tenderness.     Cervical back: Normal range of motion and neck supple. No rigidity or tenderness.     Comments: No low back spinous or paraspinal tenderness  Lymphadenopathy:     Cervical: No cervical adenopathy.  Skin:    General: Skin is warm.  Neurological:     General: No focal deficit present.     Mental Status: He is alert.  Psychiatric:        Mood and Affect: Mood normal.      UC Treatments / Results  Labs (all labs ordered are listed, but only abnormal results are displayed) Labs Reviewed  POC COVID19/FLU A&B COMBO    EKG   Radiology No results found.  Procedures Procedures (including critical care time)  Medications Ordered in UC Medications - No data to display  Initial Impression / Assessment and Plan / UC Course  I have reviewed the triage vital signs and the nursing notes.  Pertinent labs & imaging results that were available during my care of the patient were reviewed by me and considered in my medical decision making (see chart for details).  Final Clinical Impressions(s) / UC Diagnoses   Final diagnoses:  Acute cough  Viral URI with cough  Acute bilateral low back pain without sciatica     Discharge Instructions      You were seen for upper respiratory symptoms and back pain.  Your flu/covid was negative today.  You likely have a virus causing symptoms.  I have sent out a medication to help with cough.  If this is not improving or worsening then please return for re-evaluation.   For your back pain I have sent out a steroid and muscle relaxer.  This may make you tired/sleepy so please take when home and not driving.  You may use tylenol/motrin for pain as well.     ED Prescriptions     Medication Sig Dispense Auth. Provider   predniSONE  [facility] LAVELLA  21 TAB) 10 MG (21) TBPK tablet 6 tabs  for 1 day, then 5 tabs for 1 das, then 4 tabs for 1 day, then 3 tabs for 1 day, 2 tabs for 1 day, then 1 tab for 1 day 21 tablet [name], MD   methocarbamol  (ROBAXIN ) 500 MG tablet Take 1 tablet (500 mg total) by mouth 2 (two) times daily. 10 tablet [name], MD   benzonatate (TESSALON) 200 MG capsule Take 1 capsule (200 mg total) by mouth 3 (three) times daily as needed for cough. 21 capsule Darral Longs, MD      PDMP not reviewed this encounter.   Darral Longs, MD 02/20/24 1302

## 2024-02-20 NOTE — Discharge Instructions (Addendum)
 You were seen for upper respiratory symptoms and back pain.  Your flu/covid was negative today.  You likely have a virus causing symptoms.  I have sent out a medication to help with cough.  If this is not improving or worsening then please return for re-evaluation.   For your back pain I have sent out a steroid and muscle relaxer.  This may make you tired/sleepy so please take when home and not driving.  You may use tylenol/motrin for pain as well.
